# Patient Record
Sex: Female | Born: 1940 | Race: White | Hispanic: No | Smoking: Former smoker
Health system: Southern US, Community
[De-identification: ages and names within clinical notes are randomized; demographics above are authoritative.]

## PROBLEM LIST (undated history)

## (undated) DIAGNOSIS — N9489 Other specified conditions associated with female genital organs and menstrual cycle: Secondary | ICD-10-CM

## (undated) DIAGNOSIS — J449 Chronic obstructive pulmonary disease, unspecified: Secondary | ICD-10-CM

## (undated) DIAGNOSIS — M810 Age-related osteoporosis without current pathological fracture: Secondary | ICD-10-CM

## (undated) DIAGNOSIS — E785 Hyperlipidemia, unspecified: Secondary | ICD-10-CM

## (undated) DIAGNOSIS — R9389 Abnormal findings on diagnostic imaging of other specified body structures: Secondary | ICD-10-CM

## (undated) DIAGNOSIS — N390 Urinary tract infection, site not specified: Secondary | ICD-10-CM

## (undated) DIAGNOSIS — K219 Gastro-esophageal reflux disease without esophagitis: Secondary | ICD-10-CM

## (undated) HISTORY — DX: Chronic obstructive pulmonary disease, unspecified: J44.9

## (undated) HISTORY — DX: Hyperlipidemia, unspecified: E78.5

## (undated) HISTORY — DX: Abnormal findings on diagnostic imaging of other specified body structures: R93.89

## (undated) HISTORY — DX: Age-related osteoporosis without current pathological fracture: M81.0

## (undated) HISTORY — DX: Gastro-esophageal reflux disease without esophagitis: K21.9

## (undated) HISTORY — DX: Urinary tract infection, site not specified: N39.0

## (undated) HISTORY — DX: Other specified conditions associated with female genital organs and menstrual cycle: N94.89

---

## 1990-07-19 HISTORY — PX: COSMETIC SURGERY: SHX468

## 2004-05-14 ENCOUNTER — Ambulatory Visit: Payer: Self-pay | Admitting: Internal Medicine

## 2005-06-21 ENCOUNTER — Ambulatory Visit: Payer: Self-pay | Admitting: Internal Medicine

## 2005-08-02 ENCOUNTER — Ambulatory Visit: Payer: Self-pay | Admitting: Internal Medicine

## 2005-10-11 ENCOUNTER — Ambulatory Visit: Payer: Self-pay | Admitting: Neurosurgery

## 2006-05-12 ENCOUNTER — Ambulatory Visit: Payer: Self-pay | Admitting: Neurosurgery

## 2006-08-03 ENCOUNTER — Ambulatory Visit: Payer: Self-pay | Admitting: Internal Medicine

## 2006-08-09 ENCOUNTER — Ambulatory Visit: Payer: Self-pay | Admitting: Internal Medicine

## 2007-01-12 ENCOUNTER — Ambulatory Visit: Payer: Self-pay | Admitting: Gastroenterology

## 2007-02-03 ENCOUNTER — Ambulatory Visit: Payer: Self-pay | Admitting: Internal Medicine

## 2007-05-08 ENCOUNTER — Ambulatory Visit: Payer: Self-pay | Admitting: Neurosurgery

## 2007-07-07 ENCOUNTER — Ambulatory Visit: Payer: Self-pay | Admitting: Internal Medicine

## 2007-07-11 ENCOUNTER — Ambulatory Visit: Payer: Self-pay | Admitting: Internal Medicine

## 2007-07-20 HISTORY — PX: BREAST BIOPSY: SHX20

## 2007-08-29 ENCOUNTER — Ambulatory Visit: Payer: Self-pay | Admitting: Internal Medicine

## 2008-01-22 ENCOUNTER — Ambulatory Visit: Payer: Self-pay | Admitting: General Surgery

## 2008-02-06 ENCOUNTER — Ambulatory Visit: Payer: Self-pay | Admitting: General Surgery

## 2008-02-09 ENCOUNTER — Ambulatory Visit: Payer: Self-pay | Admitting: Surgery

## 2008-02-12 ENCOUNTER — Ambulatory Visit: Payer: Self-pay | Admitting: Surgery

## 2008-04-19 ENCOUNTER — Ambulatory Visit: Payer: Self-pay | Admitting: Internal Medicine

## 2009-01-23 ENCOUNTER — Ambulatory Visit: Payer: Self-pay | Admitting: Internal Medicine

## 2010-01-24 ENCOUNTER — Ambulatory Visit: Payer: Self-pay | Admitting: Gastroenterology

## 2010-01-27 ENCOUNTER — Ambulatory Visit: Payer: Self-pay | Admitting: Internal Medicine

## 2010-01-28 ENCOUNTER — Ambulatory Visit: Payer: Self-pay | Admitting: Gastroenterology

## 2010-01-30 LAB — PATHOLOGY REPORT

## 2010-07-03 ENCOUNTER — Ambulatory Visit: Payer: Self-pay | Admitting: Internal Medicine

## 2010-07-08 ENCOUNTER — Ambulatory Visit: Payer: Self-pay | Admitting: Internal Medicine

## 2010-07-30 ENCOUNTER — Encounter: Payer: Self-pay | Admitting: Neurosurgery

## 2010-08-18 ENCOUNTER — Ambulatory Visit: Payer: Self-pay | Admitting: Neurosurgery

## 2010-08-19 ENCOUNTER — Encounter: Payer: Self-pay | Admitting: Neurosurgery

## 2011-02-08 ENCOUNTER — Ambulatory Visit: Payer: Self-pay | Admitting: Internal Medicine

## 2011-07-20 LAB — HM DEXA SCAN

## 2011-09-25 ENCOUNTER — Emergency Department: Payer: Self-pay | Admitting: Emergency Medicine

## 2011-09-26 LAB — URINALYSIS, COMPLETE
Blood: NEGATIVE
Glucose,UR: NEGATIVE mg/dL (ref 0–75)
Granular Cast: 16
Hyaline Cast: 27
Nitrite: NEGATIVE
Protein: 100
RBC,UR: NONE SEEN /HPF (ref 0–5)
Specific Gravity: 1.02 (ref 1.003–1.030)

## 2011-09-26 LAB — COMPREHENSIVE METABOLIC PANEL
Alkaline Phosphatase: 69 U/L (ref 50–136)
Anion Gap: 14 (ref 7–16)
Bilirubin,Total: 0.7 mg/dL (ref 0.2–1.0)
Calcium, Total: 8.6 mg/dL (ref 8.5–10.1)
Chloride: 104 mmol/L (ref 98–107)
Co2: 26 mmol/L (ref 21–32)
EGFR (African American): >60
EGFR (Non-African Amer.): >60
SGOT(AST): 21 U/L (ref 15–37)
SGPT (ALT): 21 U/L

## 2011-09-26 LAB — CBC
HCT: 46.3 % (ref 35.0–47.0)
HGB: 15.6 g/dL (ref 12.0–16.0)
Platelet: 221 10*3/uL (ref 150–440)
RBC: 5.09 10*6/uL (ref 3.80–5.20)

## 2011-10-11 ENCOUNTER — Ambulatory Visit: Payer: Self-pay | Admitting: Gastroenterology

## 2012-02-09 ENCOUNTER — Ambulatory Visit: Payer: Self-pay | Admitting: Internal Medicine

## 2012-03-27 ENCOUNTER — Ambulatory Visit: Payer: Self-pay | Admitting: Gastroenterology

## 2012-03-30 LAB — PATHOLOGY REPORT

## 2013-01-16 LAB — HM MAMMOGRAPHY: HM MAMMO: NORMAL

## 2013-02-09 ENCOUNTER — Ambulatory Visit: Payer: Self-pay | Admitting: Internal Medicine

## 2014-01-03 DIAGNOSIS — E785 Hyperlipidemia, unspecified: Secondary | ICD-10-CM | POA: Insufficient documentation

## 2014-01-03 DIAGNOSIS — M1990 Unspecified osteoarthritis, unspecified site: Secondary | ICD-10-CM | POA: Insufficient documentation

## 2014-01-03 DIAGNOSIS — J449 Chronic obstructive pulmonary disease, unspecified: Secondary | ICD-10-CM | POA: Insufficient documentation

## 2014-01-16 ENCOUNTER — Ambulatory Visit: Payer: Self-pay | Admitting: Obstetrics and Gynecology

## 2014-01-16 LAB — HEMOGLOBIN: HGB: 13.8 g/dL (ref 12.0–16.0)

## 2014-01-21 ENCOUNTER — Ambulatory Visit: Payer: Self-pay | Admitting: Obstetrics and Gynecology

## 2014-01-23 LAB — PATHOLOGY REPORT

## 2014-02-11 ENCOUNTER — Ambulatory Visit: Payer: Self-pay | Admitting: Internal Medicine

## 2014-02-13 ENCOUNTER — Ambulatory Visit: Payer: Self-pay | Admitting: Internal Medicine

## 2014-10-11 ENCOUNTER — Emergency Department: Payer: Self-pay | Admitting: Emergency Medicine

## 2014-10-11 LAB — URINALYSIS, COMPLETE
BLOOD: NEGATIVE
Bacteria: NONE SEEN
Bilirubin,UR: NEGATIVE
GLUCOSE, UR: NEGATIVE mg/dL (ref 0–75)
Hyaline Cast: 16
Leukocyte Esterase: NEGATIVE
NITRITE: NEGATIVE
Ph: 5 (ref 4.5–8.0)
Protein: 30
RBC,UR: 1 /HPF (ref 0–5)
Specific Gravity: 1.02 (ref 1.003–1.030)
Squamous Epithelial: 1

## 2014-10-11 LAB — COMPREHENSIVE METABOLIC PANEL
ALK PHOS: 63 U/L
ANION GAP: 7 (ref 7–16)
Albumin: 3.8 g/dL
BILIRUBIN TOTAL: 0.8 mg/dL
BUN: 20 mg/dL
CHLORIDE: 108 mmol/L
CO2: 25 mmol/L
Calcium, Total: 8.6 mg/dL — ABNORMAL LOW
Creatinine: 0.79 mg/dL
EGFR (African American): >60
EGFR (Non-African Amer.): >60
Glucose: 113 mg/dL — ABNORMAL HIGH
Potassium: 4 mmol/L
SGOT(AST): 24 U/L
SGPT (ALT): 22 U/L
Sodium: 140 mmol/L
Total Protein: 6.6 g/dL

## 2014-10-11 LAB — CBC
HCT: 40.7 % (ref 35.0–47.0)
HGB: 13.6 g/dL (ref 12.0–16.0)
MCH: 30.1 pg (ref 26.0–34.0)
MCHC: 33.4 g/dL (ref 32.0–36.0)
MCV: 90 fL (ref 80–100)
PLATELETS: 219 10*3/uL (ref 150–440)
RBC: 4.52 10*6/uL (ref 3.80–5.20)
RDW: 12.9 % (ref 11.5–14.5)
WBC: 8.1 10*3/uL (ref 3.6–11.0)

## 2014-11-09 NOTE — Op Note (Signed)
PATIENT NAME:  Joyce Armstrong, Joyce Armstrong MR#:  [id number] DATE OF BIRTH:  04-20-1941  DATE OF PROCEDURE:  01/21/2014  PREOPERATIVE DIAGNOSES:  1.  Pelvic congestion syndrome.  2.  Thickened endometrium.   POSTOPERATIVE DIAGNOSES:  1.  Pelvic congestion syndrome.  2.  Thickened endometrium.   PROCEDURES PERFORMED: Hysteroscopy, dilation and curettage.  SURGEON: Hildred LaserAnika Maricus Tanzi, M.D.   ANESTHESIA: General.   ESTIMATED BLOOD LOSS: Minimal.   OPERATIVE FLUIDS: Included 800 mL of IVF.   URINE OUTPUT: 100 mL.   COMPLICATIONS: None.   FINDINGS: Atrophic-appearing endometrium. Both tubal ostia were identified. No endometrial masses appreciated.   SPECIMENS: Endometrial curettings.  TECHNIQUE: The patient was taken to the operating room where she was placed under general anesthesia without difficulty. She was then prepped and draped in normal sterile fashion in the dorsal lithotomy position. She was then straight catheterized with return of 100 mL of clear urine. Next, a sterile speculum was inserted into the vagina. A single-tooth tenaculum was used to grasp the anterior lip of the cervix. The uterus was then sounded to approximately 7.5 cm. Next, cervical dilation was performed. After this a 5 mm hysteroscope was then introduced into the uterus under direct visualization. The cavity was allowed to fill and the entire uterine cavity was explored with the findings above. The scope was removed and a sharp curette was then passed into the uterus and endometrial sampling was collected for pathology. Next, the tenaculum was removed, the speculum was removed and excellent hemostasis was noted. Instrument and sponge counts were correct x2 at the end of the procedure. The patient was taken to the PAC-U in stable condition.   ____________________________ Jacques EarthlyAnika S. Valentino Saxonherry, MD asc:sb D: 01/21/2014 08:41:58 ET T: 01/21/2014 09:11:08 ET JOB#: [id number]  cc: Jacques EarthlyAnika S. Valentino Saxonherry, MD, <Dictator> Fabian NovemberANIKA S Suad Autrey  MD ELECTRONICALLY SIGNED 01/28/2014 10:52

## 2014-12-31 ENCOUNTER — Encounter: Payer: Self-pay | Admitting: Obstetrics and Gynecology

## 2015-01-07 ENCOUNTER — Ambulatory Visit (INDEPENDENT_AMBULATORY_CARE_PROVIDER_SITE_OTHER): Payer: PPO | Admitting: Obstetrics and Gynecology

## 2015-01-07 ENCOUNTER — Encounter: Payer: Self-pay | Admitting: Obstetrics and Gynecology

## 2015-01-07 VITALS — BP 108/48 | HR 66 | Ht 62.5 in | Wt 132.8 lb

## 2015-01-07 DIAGNOSIS — L989 Disorder of the skin and subcutaneous tissue, unspecified: Secondary | ICD-10-CM | POA: Diagnosis not present

## 2015-01-07 DIAGNOSIS — Z01419 Encounter for gynecological examination (general) (routine) without abnormal findings: Secondary | ICD-10-CM | POA: Diagnosis not present

## 2015-01-07 NOTE — Progress Notes (Signed)
Subjective:    Joyce Armstrong is a 74 y.o. G3P3 single postmenopausal female who presents for annual exam. The patient has no complaints today. The patient is not sexually active. GYN screening history: last pap: patient does not recall when last pap was and was normal and last mammogram: approximate date 01/2013 and was normal.  Last colonoscopy The patient is not taking hormone replacement therapy. Patient denies post-menopausal vaginal bleeding.. The patient is not sexually active.The patient wears seatbelts: yes. The patient participates in regular exercise: no. Has the patient ever been transfused or tattooed?: no. The patient reports that there is not domestic violence in her life.   Menstrual History: OB History    Gravida Para Term Preterm AB TAB SAB Ectopic Multiple Living   3 3 3              Menarche age: 74  No LMP recorded. Patient is postmenopausal.  Denies h/o STIs or abnormal pap smears in the past.     Health Maintenance:  Last colonoscopy: 2013, normal.  Last Dexa Scan: 2013.  Osteoporosis.   Past Medical History  Diagnosis Date  . Osteoporosis   . GERD (gastroesophageal reflux disease)   . Thickened endometrium     s/p hysteroscopy d&c on 01/21/2014 benign endometrium and no endometrial masses. Repeat u/s in 6 months with thin lining, still noting some pelvic congestion.   Marland Kitchen COPD (chronic obstructive pulmonary disease)   . Hyperlipidemia   . UTI (urinary tract infection)   . Pelvic congestion     incidental finding of left adnexa vascular congestion on ct and pelvis ultrasound and possible left adnexal cyst   Family History  Problem Relation Age of Onset  . Breast cancer Maternal Grandmother   . Thyroid disease Mother    Past Surgical History  Procedure Laterality Date  . Cosmetic surgery  1992  . Breast biopsy     History   Social History  . Marital Status: Widowed    Spouse Name: N/A  . Number of Children: N/A  . Years of Education: N/A    Occupational History  . Not on file.   Social History Main Topics  . Smoking status: Former Smoker    Quit date: 10/17/2009  . Smokeless tobacco: Never Used  . Alcohol Use: No  . Drug Use: No  . Sexual Activity: Not Currently   Other Topics Concern  . Not on file   Social History Narrative   Current Outpatient Prescriptions on File Prior to Visit  Medication Sig Dispense Refill  . dicyclomine (BENTYL) 10 MG capsule Take 10 mg by mouth 4 (four) times daily -  before meals and at bedtime.    . Probiotic Product (PROBIOTIC DAILY PO) Take by mouth.     No current facility-administered medications on file prior to visit.   No Known Allergies   Review of Systems Constitutional: negative for chills, fatigue, fevers and sweats Eyes: negative for irritation, redness and visual disturbance Ears, nose, mouth, throat, and face: negative for hearing loss, nasal congestion, snoring and tinnitus Respiratory: negative for asthma, cough, sputum Cardiovascular: negative for chest pain, dyspnea, exertional chest pressure/discomfort, irregular heart beat, palpitations and syncope Gastrointestinal: negative for abdominal pain, change in bowel habits, nausea and vomiting Genitourinary: negative for abnormal menstrual periods, genital lesions, sexual problems and vaginal discharge, dysuria and urinary incontinence Integument/breast: negative for breast lump, breast tenderness and nipple discharge Hematologic/lymphatic: negative for bleeding and easy bruising Musculoskeletal:positive for joint pain; negative for back pain  and muscle weakness Neurological: negative for dizziness, headaches, vertigo and weakness Endocrine: negative for diabetic symptoms including polydipsia, polyuria and skin dryness Allergic/Immunologic: negative for hay fever and urticaria      Objective:    BP 108/48 mmHg  Pulse 66  Ht 5' 2.5" (1.588 m)  Wt 132 lb 12.8 oz (60.238 kg)  BMI 23.89 kg/m2  General  Appearance:    Alert, cooperative, no distress, appears stated age  Head:    Normocephalic, without obvious abnormality, atraumatic  Eyes:    PERRL, conjunctiva/corneas clear, EOM's intact, both eyes  Ears:    Normal external ear canals, both ears  Nose:   Nares normal, septum midline, mucosa normal, no drainage or sinus tenderness  Throat:   Lips, mucosa, and tongue normal; teeth and gums normal  Neck:   Supple, symmetrical, trachea midline, no adenopathy;  thyroid: no enlargement/tenderness/nodules; no carotid bruit or JVD  Back:     Symmetric, no curvature, ROM normal, no CVA tenderness  Lungs:     Clear to auscultation bilaterally, respirations unlabored  Chest Wall:    No tenderness or deformity   Heart:    Regular rate and rhythm, S1 and S2 normal, no murmur, rub   or gallop  Breast Exam:    No tenderness, masses, or nipple abnormality  Abdomen:     Soft, non-tender, bowel sounds active all four quadrants, no masses, no organomegaly  Genitalia:    Normal female without lesion, discharge or tenderness  Rectal:    Normal external sphincter.  Internal not done.   Extremities:   Extremities normal, atraumatic, no cyanosis or edema  Pulses:   2+ and symmetric all extremities  Skin:   Skin color, texture, turgor normal, no rashes.  Several small moles and skin tags throughout body.  Small (~ 5 mm) hyperpigmented, raised, well circumscribed lesion on left buttock.   Lymph nodes:   Cervical, supraclavicular, and axillary nodes normal  Neurologic:   CNII-XII intact, normal strength, sensation and reflexes throughout      Assessment:    Normal gyn exam Menopause   Arthritis SKin lesion on buttock (skin tag vs mole) - new lesion per patient.  Plan:    Breast self exam technique reviewed and patient encouraged to perform self-exam monthly. Discussed healthy lifestyle modifications.  Encouraged exercise routine/weight bearing exercises.  Mammogram. To be scheduled.  Patient has appointment  today also for PCP.  Will possibly have annual labs drawn there.  Given annual screening labs as future order in case PCP does not draw labs.  Will have Dermatologist to look at area on buttock at next appointment, to see if biopsy is recommended.  Otherwise can RTC and have it followed and potentially excised here in office.  RTC in 1-2 years.     Hildred Laser, MD [facility]

## 2015-01-08 ENCOUNTER — Other Ambulatory Visit: Payer: Self-pay | Admitting: Internal Medicine

## 2015-01-08 DIAGNOSIS — M810 Age-related osteoporosis without current pathological fracture: Secondary | ICD-10-CM | POA: Insufficient documentation

## 2015-01-08 DIAGNOSIS — Z1231 Encounter for screening mammogram for malignant neoplasm of breast: Secondary | ICD-10-CM

## 2015-02-17 ENCOUNTER — Ambulatory Visit
Admission: RE | Admit: 2015-02-17 | Discharge: 2015-02-17 | Disposition: A | Discharge status: Home / Self Care | Payer: PPO | Source: Ambulatory Visit | Attending: Internal Medicine | Admitting: Internal Medicine

## 2015-02-17 DIAGNOSIS — Z1231 Encounter for screening mammogram for malignant neoplasm of breast: Secondary | ICD-10-CM

## 2015-08-20 ENCOUNTER — Other Ambulatory Visit: Payer: Self-pay | Admitting: Neurosurgery

## 2015-08-20 DIAGNOSIS — M5440 Lumbago with sciatica, unspecified side: Secondary | ICD-10-CM

## 2015-09-01 ENCOUNTER — Ambulatory Visit
Admission: RE | Admit: 2015-09-01 | Discharge: 2015-09-01 | Disposition: A | Discharge status: Home / Self Care | Payer: PPO | Source: Ambulatory Visit | Attending: Neurosurgery | Admitting: Neurosurgery

## 2015-09-01 DIAGNOSIS — M5440 Lumbago with sciatica, unspecified side: Secondary | ICD-10-CM | POA: Diagnosis present

## 2015-09-01 DIAGNOSIS — M4806 Spinal stenosis, lumbar region: Secondary | ICD-10-CM | POA: Diagnosis not present

## 2015-09-01 DIAGNOSIS — M5126 Other intervertebral disc displacement, lumbar region: Secondary | ICD-10-CM | POA: Diagnosis not present

## 2015-09-01 DIAGNOSIS — M47816 Spondylosis without myelopathy or radiculopathy, lumbar region: Secondary | ICD-10-CM | POA: Insufficient documentation

## 2015-09-03 ENCOUNTER — Other Ambulatory Visit: Payer: Self-pay | Admitting: Nurse Practitioner

## 2015-09-03 DIAGNOSIS — R1032 Left lower quadrant pain: Principal | ICD-10-CM

## 2015-09-03 DIAGNOSIS — R1031 Right lower quadrant pain: Secondary | ICD-10-CM

## 2015-09-04 ENCOUNTER — Ambulatory Visit
Admission: RE | Admit: 2015-09-04 | Discharge: 2015-09-04 | Disposition: A | Discharge status: Home / Self Care | Payer: PPO | Source: Ambulatory Visit | Attending: Nurse Practitioner | Admitting: Nurse Practitioner

## 2015-09-04 DIAGNOSIS — R1030 Lower abdominal pain, unspecified: Secondary | ICD-10-CM | POA: Diagnosis present

## 2015-09-04 DIAGNOSIS — R1032 Left lower quadrant pain: Secondary | ICD-10-CM

## 2015-09-04 DIAGNOSIS — R1031 Right lower quadrant pain: Secondary | ICD-10-CM

## 2015-09-04 MED ORDER — IOHEXOL 300 MG/ML  SOLN
100.0000 mL | Freq: Once | INTRAMUSCULAR | Status: AC | PRN
  Administered 2015-09-04: 100 mL via INTRAVENOUS

## 2015-09-11 ENCOUNTER — Ambulatory Visit: Payer: PPO | Attending: Internal Medicine | Admitting: Physical Therapy

## 2015-09-11 DIAGNOSIS — X58XXXA Exposure to other specified factors, initial encounter: Secondary | ICD-10-CM | POA: Insufficient documentation

## 2015-09-11 DIAGNOSIS — M25512 Pain in left shoulder: Secondary | ICD-10-CM | POA: Insufficient documentation

## 2015-09-11 DIAGNOSIS — T148 Other injury of unspecified body region: Secondary | ICD-10-CM | POA: Diagnosis present

## 2015-09-11 DIAGNOSIS — M542 Cervicalgia: Secondary | ICD-10-CM | POA: Insufficient documentation

## 2015-09-11 DIAGNOSIS — M2560 Stiffness of unspecified joint, not elsewhere classified: Secondary | ICD-10-CM | POA: Diagnosis present

## 2015-09-11 DIAGNOSIS — T148XXA Other injury of unspecified body region, initial encounter: Secondary | ICD-10-CM

## 2015-09-11 DIAGNOSIS — R531 Weakness: Secondary | ICD-10-CM | POA: Insufficient documentation

## 2015-09-11 NOTE — Therapy (Signed)
[place] [facility] [address] [place], [address] Phone: [phone]   Fax:  [phone]  Physical Therapy Evaluation  Patient Details  Name: Joyce Armstrong MRN: [id number] Date of Birth: [birth year] Referring Provider: Ronaldo Miyamoto L. Alphonzo Lemmings, MD  Encounter Date: 09/11/2015      PT End of Session - 09/11/15 1407    Visit Number 1   Number of Visits 13   Date for PT Re-Evaluation 10/23/15   PT Start Time 1130   PT Stop Time 1230   PT Time Calculation (min) 60 min   Activity Tolerance Patient tolerated treatment well   Behavior During Therapy [facility] for tasks assessed/performed      Past Medical History  Diagnosis Date  . Osteoporosis   . GERD (gastroesophageal reflux disease)   . Thickened endometrium     s/p hysteroscopy d&c on 01/21/2014 benign endometrium and no endometrial masses. will repeat u/s in 6 months to reassess lining and congestion.  Marland Kitchen COPD (chronic obstructive pulmonary disease)   . Hyperlipidemia   . UTI (urinary tract infection)   . Pelvic congestion     incidental finding of left adnexa vascular congestion on ct and pelvis ultrasound and possible left adnexal cyst    Past Surgical History  Procedure Laterality Date  . Cosmetic surgery  1992  . Breast biopsy Right 2009    neg    There were no vitals filed for this visit.  Visit Diagnosis:  Muscle tear  Weakness  Range of motion deficit      Subjective Assessment - 09/11/15 1324    Subjective Pt reports Hx of L shoulder pain beginning September of last year.  Cc  of difficulty lifting anything with L UE, reports cortisone shot 2 or more weeks ago which helped as she is now able to lift arm overhead  w/o pain if done slowly. Pt is currently having difficulty washing back, fairly constant and current pain of 4/10 reaching 5/10 when lifting, reports pain as feeling "like I have been bruised on the shoulder" in the deltoid region.  Pt enjoys  playing cards on Tuesdays and Friday, has not been performing regular exercise, past Hx of regular walking, often watches TV and plays games on an ipad, primarily sedentary lifestyle.    Pertinent History Cortisone shot in shoulder 2 + weeks ago   Limitations Lifting   Diagnostic tests MRI showing mild distal supraspinatus tendinopathy, partial thickness articular surface tearing of the subscapularis tendon    Patient Stated Goals regular walking, no pain when lifting things, full use of L UE during ADLs   Currently in Pain? Yes   Pain Score 4    Pain Location Shoulder   Pain Orientation Left   Pain Descriptors / Indicators Constant;Dull            [facility] PT Assessment - 09/11/15 0001    Assessment   Medical Diagnosis supraspinatus tendionpathy, partial thickness articular surface tearing of the subscap   Referring Provider Karma Ganja MD   Onset Date/Surgical Date 03/20/15   Hand Dominance Right   Next MD Visit march 15th   Home Environment   Living Environment Private residence   Prior Function   Level of Independence Independent   Vocation Retired   Clinical research associate, games on ipad   Cognition   Overall Cognitive Status Within Functional Limits for tasks assessed   Functional Tests   Functional tests --  HBB/HBH = B, [facility]  Posture/Postural Control   Posture/Postural Control Postural limitations   Postural Limitations Rounded Shoulders  sacral sitting   ROM / Strength   AROM / PROM / Strength PROM;Strength;AROM   AROM   Overall AROM Comments decreased shoulder flexion, abduction, and ER. Measurments taken passivly    PROM   Overall PROM Comments R: flexion = 170, abd = 168, IR = 80, ER = 95. L: flexion = 146, abd = 125, IR = 85, ER = 40   Strength   Overall Strength Comments L IR = 3/5   Palpation   Palpation comment tenderness at infraspinatus, delts, teres minor/major   Ambulation/Gait   Ambulation/Gait Yes   Gait Comments decreased L arm swing       Objective:   STM of supraspinatus, deltoids, infraspinatus, teres minor/major was performed to address pt complaints of continueous pain in these areas. Pt tolerated well reporting decreased resting pain to 2/10 post STM. Next, IR/ER isometrics form and posture was demonstrated using a doorframe. Pt performed 6 sets for both IR/ER with 6sec holds. Pt tolerated well reporting no increase in pain. Pt required cueing for shoulder depression and retraction.  Instructed to repeat the above exercise 2X daily or prn for pain.                       PT Education - 09/11/15 1405    Education provided Yes   Education Details ADLs and exercises only in nonpainful ROM, IR/ER isometrics, increase activity   Person(s) Educated Patient   Methods Explanation   Comprehension Verbalized understanding             PT Long Term Goals - 09/11/15 1434    PT LONG TERM GOAL #1   Title Pt will lift 10 pounds from hip level to counter height w/ L UE w/o pain to facilitate non painful ADLs such as transporting groceries.    Baseline currently painful lifting above hip level   Time 6   Period Weeks   Status New   PT LONG TERM GOAL #2   Title Pt will be able to perform relaxed arm swings for 1 min w/o pain to facilitate ideal gait w/o pain   Baseline pain w/ gait/arm swinging   Time 4   Period Weeks   Status New   PT LONG TERM GOAL #3   Title Pt will be able to vacuum her living room w/o shoulder pain to maintain independence   Time 6   Period Weeks   Status New               Plan - 09/11/15 1430    Clinical Impression Statement Pt is a pleasant 75 y.o female presenting with difficulty performing L UE elevation, decreased ER strength, and tenderness of deltoids, infraspinatus, and teres major/minor area. Pt is limited in ADLs primary involving any UE lifting activities and vacuuming. Pt reports continuous dull pain of the deltoid which has limited social interaction such as  playing cards and walking. Pt will benefit from PT to facilitate RC partial tear/tendinopathy healing process, address shoulder strength deficits, and education in regards to increasing general activity level for long-term health.      Pt will benefit from skilled therapeutic intervention in order to improve on the following deficits Decreased activity tolerance;Decreased mobility;Decreased strength;Impaired UE functional use;Decreased endurance;Decreased range of motion;Difficulty walking   Rehab Potential Good   Clinical Impairments Affecting Rehab Potential age, proir level of activity   PT  Frequency 2x / week   PT Duration 6 weeks   PT Treatment/Interventions Therapeutic exercise;Therapeutic activities;Functional mobility training;Neuromuscular re-education;Patient/family education;Manual techniques;Passive range of motion   PT Next Visit Plan assess performance of HEP, ER strength to onset of pain, encourage functional movement of UE such as arm bike   PT Home Exercise Plan IR/ER isometrics   Consulted and Agree with Plan of Care Patient         Problem List Patient Active Problem List   Diagnosis Date Noted  . Osteoporosis 01/08/2015  . Arthritis 01/03/2014  . CAFL (chronic airflow limitation) (HCC) 01/03/2014  . HLD (hyperlipidemia) 01/03/2014    Waldemar Dickens STM 09/11/2015, 2:52 PM  Su Hoff PT DPT  [place] [facility] SPORTS MEDICINE [address] [place], [address] Phone: [phone]   Fax:  [phone]  Name: Joyce Armstrong MRN: [id number] Date of Birth: 1941/07/12

## 2015-09-16 ENCOUNTER — Encounter: Payer: PPO | Admitting: Physical Therapy

## 2015-09-16 ENCOUNTER — Ambulatory Visit: Payer: PPO | Admitting: Physical Therapy

## 2015-09-16 DIAGNOSIS — M25512 Pain in left shoulder: Secondary | ICD-10-CM

## 2015-09-16 DIAGNOSIS — M542 Cervicalgia: Secondary | ICD-10-CM

## 2015-09-16 DIAGNOSIS — R531 Weakness: Secondary | ICD-10-CM

## 2015-09-16 DIAGNOSIS — T148 Other injury of unspecified body region: Secondary | ICD-10-CM | POA: Diagnosis not present

## 2015-09-16 NOTE — Therapy (Signed)
[place] [facility] [address] [place], [address] Phone: [phone]   Fax:  [phone]  Physical Therapy Treatment  Patient Details  Name: Joyce Armstrong MRN: [id number] Date of Birth: [birth year] Referring Provider: Nilda Simmer  Encounter Date: 09/16/2015      PT End of Session - 09/16/15 1123    Visit Number 2   Number of Visits 13   Date for PT Re-Evaluation 10/23/15   PT Start Time 1030   PT Stop Time 1115   PT Time Calculation (min) 45 min   Activity Tolerance Patient tolerated treatment well   Behavior During Therapy [facility] for tasks assessed/performed      Past Medical History  Diagnosis Date  . Osteoporosis   . GERD (gastroesophageal reflux disease)   . Thickened endometrium     s/p hysteroscopy d&c on 01/21/2014 benign endometrium and no endometrial masses. will repeat u/s in 6 months to reassess lining and congestion.  Marland Kitchen COPD (chronic obstructive pulmonary disease)   . Hyperlipidemia   . UTI (urinary tract infection)   . Pelvic congestion     incidental finding of left adnexa vascular congestion on ct and pelvis ultrasound and possible left adnexal cyst    Past Surgical History  Procedure Laterality Date  . Cosmetic surgery  1992  . Breast biopsy Right 2009    neg    There were no vitals filed for this visit.  Visit Diagnosis:  Weakness  Cervicalgia  Left shoulder pain      Subjective Assessment - 09/16/15 1031    Subjective Pt reports minimal to no pain in her arm until today when her pain became significant. As the day continued pt has had incr. pain. She has not taken any tylenol.    Pertinent History Cortisone shot in shoulder 2 + weeks ago   Limitations Lifting   Diagnostic tests MRI showing mild distal supraspinatus tendinopathy, partial thickness articular surface tearing of the subscapularis tendon    Patient Stated Goals regular walking, no pain when lifting things,  full use of L UE during ADLs   Currently in Pain? Yes   Pain Score 3    Pain Location Arm   Pain Orientation Left              Objective: CPAs grade II 3x1 min T2-T5, L UPAs in same region. Following this ROM improved 15 deg. For shoulder flex before onset of pain.  STM performed on L levator, supraspinatus, deltoid. Noted significant taut band in deltoid which improved with manual intervention.  Cervical retractions, 5 min spent teaching and practicing, requiring cuing for correct performance and breathing.  Added RTB scapular retraction with cervical retraction, 3x10. Pt does not feel comfortable performing this as HEP.  Performed "W" exercise for isometric activation of low trap, 5x5 sec. Holds.  Reviewed HEP, corrected wall isometrics.                   PT Education - 09/16/15 1118    Education provided Yes   Education Details corrected exercises and added additional exercises to address trunk control.              PT Long Term Goals - 09/11/15 1434    PT LONG TERM GOAL #1   Title Pt will lift 10 pounds from hip level to counter height w/ L UE w/o pain to facilitate non painful ADLs such as transporting groceries.    Baseline  currently painful lifting above hip level   Time 6   Period Weeks   Status New   PT LONG TERM GOAL #2   Title Pt will be able to perform relaxed arm swings for 1 min w/o pain to facilitate ideal gait w/o pain   Baseline pain w/ gait/arm swinging   Time 4   Period Weeks   Status New   PT LONG TERM GOAL #3   Title Pt will be able to vacuum her living room w/o shoulder pain to maintain independence   Time 6   Period Weeks   Status New               Plan - 09/16/15 1124    Clinical Impression Statement Pt appears to have made improvement in resting pain, however no change with L UE elevation. required considerable correction on her exercises and lacks confidence in her ability to perform exercises without  considerable guidance. Pt appears to have cervical issues related to her shoulder pain and would benefit from continued PT to address this.   Pt will benefit from skilled therapeutic intervention in order to improve on the following deficits Decreased activity tolerance;Decreased mobility;Decreased strength;Impaired UE functional use;Decreased endurance;Decreased range of motion;Difficulty walking   Rehab Potential Good   Clinical Impairments Affecting Rehab Potential age, proir level of activity   PT Frequency 2x / week   PT Duration 6 weeks   PT Treatment/Interventions Therapeutic exercise;Therapeutic activities;Functional mobility training;Neuromuscular re-education;Patient/family education;Manual techniques;Passive range of motion   PT Next Visit Plan assess performance of HEP, ER strength to onset of pain, encourage functional movement of UE such as arm bike   PT Home Exercise Plan IR/ER isometrics   Consulted and Agree with Plan of Care Patient        Problem List Patient Active Problem List   Diagnosis Date Noted  . Osteoporosis 01/08/2015  . Arthritis 01/03/2014  . CAFL (chronic airflow limitation) (HCC) 01/03/2014  . HLD (hyperlipidemia) 01/03/2014    Joyce Armstrong PT DPT 09/16/2015, 11:26 AM  [place] [facility] SPORTS MEDIC[facility] [address] [place], [address] Phone: [phone]   Fax:  [phone]  Name: Joyce Armstrong MRN: [id number] Date of Birth: 04-Jun-1941

## 2015-09-18 ENCOUNTER — Ambulatory Visit: Payer: PPO | Attending: Orthopedic Surgery | Admitting: Physical Therapy

## 2015-09-18 DIAGNOSIS — M25552 Pain in left hip: Secondary | ICD-10-CM | POA: Diagnosis present

## 2015-09-18 DIAGNOSIS — T148 Other injury of unspecified body region: Secondary | ICD-10-CM | POA: Diagnosis present

## 2015-09-18 DIAGNOSIS — M545 Low back pain: Secondary | ICD-10-CM | POA: Insufficient documentation

## 2015-09-18 DIAGNOSIS — G8929 Other chronic pain: Secondary | ICD-10-CM | POA: Insufficient documentation

## 2015-09-18 DIAGNOSIS — M25551 Pain in right hip: Secondary | ICD-10-CM | POA: Insufficient documentation

## 2015-09-18 DIAGNOSIS — X58XXXA Exposure to other specified factors, initial encounter: Secondary | ICD-10-CM | POA: Diagnosis not present

## 2015-09-18 DIAGNOSIS — M25512 Pain in left shoulder: Secondary | ICD-10-CM | POA: Insufficient documentation

## 2015-09-18 DIAGNOSIS — M25659 Stiffness of unspecified hip, not elsewhere classified: Secondary | ICD-10-CM | POA: Insufficient documentation

## 2015-09-18 DIAGNOSIS — R531 Weakness: Secondary | ICD-10-CM | POA: Diagnosis present

## 2015-09-18 NOTE — Therapy (Signed)
[facility] [address] [place], [address] Phone: [phone]   Fax:  [phone]  Physical Therapy Treatment  Patient Details  Name: Joyce Armstrong MRN: [id number] Date of Birth: 1941-03-03 Referring Provider: Nilda Simmer  Encounter Date: 09/18/2015      PT End of Session - 09/18/15 1357    Visit Number 3   Number of Visits 13   Date for PT Re-Evaluation 10/23/15   PT Start Time 0145   PT Stop Time 0230   PT Time Calculation (min) 45 min   Activity Tolerance Patient tolerated treatment well   Behavior During Therapy [facility] for tasks assessed/performed      Past Medical History  Diagnosis Date  . Osteoporosis   . GERD (gastroesophageal reflux disease)   . Thickened endometrium     s/p hysteroscopy d&c on 01/21/2014 benign endometrium and no endometrial masses. will repeat u/s in 6 months to reassess lining and congestion.  Marland Kitchen COPD (chronic obstructive pulmonary disease)   . Hyperlipidemia   . UTI (urinary tract infection)   . Pelvic congestion     incidental finding of left adnexa vascular congestion on ct and pelvis ultrasound and possible left adnexal cyst    Past Surgical History  Procedure Laterality Date  . Cosmetic surgery  1992  . Breast biopsy Right 2009    neg    There were no vitals filed for this visit.  Visit Diagnosis:  Left shoulder pain  Weakness      Subjective Assessment - 09/18/15 1355    Subjective Pt continues to have moderate pain in shoulder since Monday. She reports her pain level is better but it feels like a "bruise" in her shoulder. Today pt reports no pain but significant soreness.   Pertinent History Cortisone shot in shoulder 2 + weeks ago   Limitations Lifting   Diagnostic tests MRI showing mild distal supraspinatus tendinopathy, partial thickness articular surface tearing of the subscapularis tendon    Patient Stated Goals regular walking, no pain when  lifting things, full use of L UE during ADLs   Currently in Pain? No/denies             Objective: STM performed on deltoid, deltoid insertion, biceps. Following this pt reported improvement in resting "tightness".  AROM for shoulder elevation in multiple orientations. Pain free with true flexion.  Biceps isometrics 5x1 min holds with 3# wt at 90 deg. Elbow flexion. Following this pt reported incr. Biceps pain with shoulder elevation.  GTB scapular retractions 3x10 with focus on biceps activation.  Additional STM on biceps as biceps were irritated following previous exercises.                         PT Long Term Goals - 09/11/15 1434    PT LONG TERM GOAL #1   Title Pt will lift 10 pounds from hip level to counter height w/ L UE w/o pain to facilitate non painful ADLs such as transporting groceries.    Baseline currently painful lifting above hip level   Time 6   Period Weeks   Status New   PT LONG TERM GOAL #2   Title Pt will be able to perform relaxed arm swings for 1 min w/o pain to facilitate ideal gait w/o pain   Baseline pain w/ gait/arm swinging   Time 4   Period Weeks   Status New   PT LONG  TERM GOAL #3   Title Pt will be able to vacuum her living room w/o shoulder pain to maintain independence   Time 6   Period Weeks   Status New               Plan - 09/18/15 1503    Clinical Impression Statement now able to perform L rotation in strict saggital plane pain free. continues to have pain with other elevation positions. repeated elevation produced biceps pain so focused on isometric biceps activation. pt would benefit from continued PT to address muscle weakness, impaired ROM, and pain.   Pt will benefit from skilled therapeutic intervention in order to improve on the following deficits Decreased activity tolerance;Decreased mobility;Decreased strength;Impaired UE functional use;Decreased endurance;Decreased range of motion;Difficulty  walking   Rehab Potential Good   Clinical Impairments Affecting Rehab Potential age, proir level of activity   PT Frequency 2x / week   PT Duration 6 weeks   PT Treatment/Interventions Therapeutic exercise;Therapeutic activities;Functional mobility training;Neuromuscular re-education;Patient/family education;Manual techniques;Passive range of motion   PT Next Visit Plan assess performance of HEP, ER strength to onset of pain, encourage functional movement of UE such as arm bike   PT Home Exercise Plan IR/ER isometrics   Consulted and Agree with Plan of Care Patient        Problem List Patient Active Problem List   Diagnosis Date Noted  . Osteoporosis 01/08/2015  . Arthritis 01/03/2014  . CAFL (chronic airflow limitation) (HCC) 01/03/2014  . HLD (hyperlipidemia) 01/03/2014    Joyce Armstrong PT DPT 09/18/2015, 3:12 PM  [place] [facility] PHYSICAL AND SPORTS MEDICINE [address] [place], [address] Phone: [phone]   Fax:  [phone]  Name: Joyce Armstrong MRN: [id number] Date of Birth: 1940/12/25

## 2015-09-22 ENCOUNTER — Ambulatory Visit: Payer: PPO

## 2015-09-22 DIAGNOSIS — R531 Weakness: Secondary | ICD-10-CM

## 2015-09-22 DIAGNOSIS — M25512 Pain in left shoulder: Secondary | ICD-10-CM | POA: Diagnosis not present

## 2015-09-22 DIAGNOSIS — T148XXA Other injury of unspecified body region, initial encounter: Secondary | ICD-10-CM

## 2015-09-22 NOTE — Therapy (Signed)
Joyce Armstrong [facility] [address] [place], [place], [address] Phone: [phone]   Fax:  [phone]  Physical Therapy Treatment  Patient Details  Name: Joyce Armstrong Joyce Armstrong MRN: [id number] Date of Birth: 06-15-1941 Referring Provider: Nilda Simmerobert A Wainer  Encounter Date: 09/22/2015      PT End of Session - 09/22/15 1349    Visit Number 4   Number of Visits 13   Date for PT Re-Evaluation 10/23/15   PT Start Time 1300   PT Stop Time 1345   PT Time Calculation (min) 45 min   Activity Tolerance Patient tolerated treatment well   Behavior [facility] for tasks assessed/performed      Past Medical History  Diagnosis Date  . Osteoporosis   . GERD (gastroesophageal reflux disease)   . Thickened endometrium     s/p hysteroscopy d&c on 01/21/2014 benign endometrium and no endometrial masses. will repeat u/s in 6 months to reassess lining and congestion.  Marland Kitchen. COPD (chronic obstructive pulmonary disease) (HCC)   . Hyperlipidemia   . UTI (urinary tract infection)   . Pelvic congestion     incidental finding of left adnexa vascular congestion on ct and pelvis ultrasound and possible left adnexal cyst    Past Surgical History  Procedure Laterality Date  . Cosmetic surgery  1992  . Breast biopsy Right 2009    neg    There were no vitals filed for this visit.  Visit Diagnosis:  Left shoulder pain  Weakness  Muscle tear      Subjective Assessment - 09/22/15 1307    Subjective Pt reports that she is not having any pain in her L shoulder at rest at this time. She still complains of L shoulder pain with overhead activities. Pt states that she did not perform any of her HEP over the weekend.    Pertinent History Cortisone shot in shoulder 2 + weeks ago   Limitations Lifting   Diagnostic tests MRI showing mild distal supraspinatus tendinopathy, partial thickness articular surface tearing of the subscapularis tendon    Patient  Stated Goals regular walking, no pain when lifting things, full use of L UE during ADLs   Currently in Pain? No/denies       Objective:  Manual Therapy STM performed on anterior deltoid and long head bicep tendon; Pt reports pain with palpation of long head bicep tendon.   There-ex Arm bike 2 min forward/2 min backward during history Eccentric L bicep flexion 10 second contraction x 10; Seated red Tband mid and low rows 2 x 10 each; Red Tband scapular retractions 2 x 10; Supine serratus punch with manual resistance 2 x 10; L shoulder ER, IR, and abduction isometrics 10 seconds x 5 each; L elbow flexion and extension isometrics 10 second hold x 5 each;                          PT Education - 09/22/15 1348    Education provided Yes   Education Details HEP reviewed and reinforced   Person(s) Educated Patient   Methods Explanation;Demonstration   Comprehension Verbalized understanding             PT Long Term Goals - 09/11/15 1434    PT LONG TERM GOAL #1   Title Pt will lift 10 pounds from hip level to counter height w/ L UE w/o pain to facilitate non painful ADLs such as transporting groceries.  Baseline currently painful lifting above hip level   Time 6   Period Weeks   Status New   PT LONG TERM GOAL #2   Title Pt will be able to perform relaxed arm swings for 1 min w/o pain to facilitate ideal gait w/o pain   Baseline pain w/ gait/arm swinging   Time 4   Period Weeks   Status New   PT LONG TERM GOAL #3   Title Pt will be able to vacuum her living room w/o shoulder pain to maintain independence   Time 6   Period Weeks   Status New               Plan - 09/22/15 1349    Clinical Impression Statement Pt reports none to minimal pain with flexion following exercises. No pain with scaption. Pt with significant tenderness of L long head bicep tendon during STM. Pt requires cues to avoid shoulder hiking during scapular retractions. Pt will  continue to benefit from skilled interventions to decreased L shoulder pain and improve function at home.    Pt will benefit from skilled therapeutic intervention in order to improve on the following deficits Decreased activity tolerance;Decreased mobility;Decreased strength;Impaired UE functional use;Decreased endurance;Decreased range of motion;Difficulty walking   Rehab Potential Good   Clinical Impairments Affecting Rehab Potential age, proir level of activity   PT Frequency 2x / week   PT Duration 6 weeks   PT Treatment/Interventions Therapeutic exercise;Therapeutic activities;Functional mobility training;Neuromuscular re-education;Patient/family education;Manual techniques;Passive range of motion   PT Next Visit Plan Repeat arm bike as decreases pain. Continue with strengthening and manual techniques for L shoulder   PT Home Exercise Plan IR/ER isometrics, encouraged to continue as prescribed.    Consulted and Agree with Plan of Care Patient        Problem List Patient Active Problem List   Diagnosis Date Noted  . Osteoporosis 01/08/2015  . Arthritis 01/03/2014  . CAFL (chronic airflow limitation) (HCC) 01/03/2014  . HLD (hyperlipidemia) 01/03/2014   Lynnea Maizes PT, DPT   Tyra Gural 09/22/2015, 1:53 PM  [place] [facility] PHYSICAL AND SPORTS MEDICINE [address], [place], [address] Phone: [phone]   Fax:  [phone]  Name: Joyce Armstrong MRN: [id number] Date of Birth: Aug 26, 1940

## 2015-09-25 ENCOUNTER — Ambulatory Visit: Payer: PPO | Admitting: Physical Therapy

## 2015-09-25 DIAGNOSIS — M25512 Pain in left shoulder: Secondary | ICD-10-CM

## 2015-09-25 NOTE — Therapy (Signed)
[place] [facility] SPORTS MEDICINE [address] [place], [address] Phone: [phone]   Fax:  [phone]  Physical Therapy Treatment  Patient Details  Name: Joyce Armstrong MRN: [id number] Date of Birth: 1941/03/31 Referring Provider: Lorn Junes  Encounter Date: 09/25/2015      PT End of Session - 09/25/15 1400    Visit Number 5   Number of Visits 13   Date for PT Re-Evaluation 10/23/15   PT Start Time 0300   PT Stop Time 1410   PT Time Calculation (min) 25 min   Activity Tolerance Patient tolerated treatment well   Behavior During Therapy [facility] for tasks assessed/performed      Past Medical History  Diagnosis Date  . Osteoporosis   . GERD (gastroesophageal reflux disease)   . Thickened endometrium     s/p hysteroscopy d&c on 01/21/2014 benign endometrium and no endometrial masses. will repeat u/s in 6 months to reassess lining and congestion.  Marland Kitchen COPD (chronic obstructive pulmonary disease) ([place])   . Hyperlipidemia   . UTI (urinary tract infection)   . Pelvic congestion     incidental finding of left adnexa vascular congestion on ct and pelvis ultrasound and possible left adnexal cyst    Past Surgical History  Procedure Laterality Date  . Cosmetic surgery  1992  . Breast biopsy Right 2009    neg    There were no vitals filed for this visit.  Visit Diagnosis:  Left shoulder pain      Subjective Assessment - 09/25/15 1351    Subjective Pt reports she is doing significantly better. She still gets some irritation in her L shoulder.   Pertinent History Cortisone shot in shoulder 2 + weeks ago   Limitations Lifting   Diagnostic tests MRI showing mild distal supraspinatus tendinopathy, partial thickness articular surface tearing of the subscapularis tendon    Patient Stated Goals regular walking, no pain when lifting things, full use of L UE during ADLs   Currently in Pain? No/denies   Pain Score 0-No pain             Objective: Reviewed HEP. Palpated through back, shoulder and arm. Noted significant improvement in all areas of palpation from eval other than biceps tendon, which is moderately irritated. Issued and had pt perform biceps isometrics in order to treat this tendinopathy which resulted in decr. Pain with palpation of biceps. Also modified pts standing and sitting posture, and posture when pt is playing card games.  Answered pt questions regarding continuation of therapy and mechanisms for avoiding pain by avoiding abduction while shoulder continues to heal. Pt requested to end PT early as she has minimal problem with shoulder currently.                      PT Education - 09/25/15 1359    Education provided Yes   Education Details d/c from PT with progression of ther ex.   Person(s) Educated Patient   Methods Explanation;Demonstration   Comprehension Verbalized understanding;Returned demonstration             PT Long Term Goals - 09/25/15 1433    PT LONG TERM GOAL #1   Title Pt will lift 10 pounds from hip level to counter height w/ L UE w/o pain to facilitate non painful ADLs such as transporting groceries.    Baseline currently painful lifting above hip level   Time 6   Period Weeks  Status Partially Met   PT LONG TERM GOAL #2   Title Pt will be able to perform relaxed arm swings for 1 min w/o pain to facilitate ideal gait w/o pain   Baseline pain w/ gait/arm swinging   Time 4   Period Weeks   Status Achieved   PT LONG TERM GOAL #3   Title Pt will be able to vacuum her living room w/o shoulder pain to maintain independence   Time 6   Period Weeks   Status Achieved               Plan - 10/07/15 1401    Clinical Impression Statement At this time pt is appropriate for d/c from PT for shoulder pain. She does continue to have difficulty with her shoulder but at this time is having minimal pain. She is able to manage the pain well with  strategies learned in PT. Pt does have apparent biceps tendinopathy which appears to be responding well to light isometrics. PT will continue to monitor pt as she will be seen in the near future for back pain.   Pt will benefit from skilled therapeutic intervention in order to improve on the following deficits Decreased activity tolerance;Decreased mobility;Decreased strength;Impaired UE functional use;Decreased endurance;Decreased range of motion;Difficulty walking;Postural dysfunction   Rehab Potential Good   Clinical Impairments Affecting Rehab Potential age, proir level of activity   PT Frequency 2x / week   PT Duration 6 weeks   PT Treatment/Interventions Therapeutic exercise;Therapeutic activities;Functional mobility training;Neuromuscular re-education;Patient/family education;Manual techniques;Passive range of motion   PT Next Visit Plan Repeat arm bike as decreases pain. Continue with strengthening and manual techniques for L shoulder   PT Home Exercise Plan IR/ER isometrics, encouraged to continue as prescribed.    Consulted and Agree with Plan of Care Patient          G-Codes - Oct 07, 2015 1434    Functional Assessment Tool Used NPRS, QuickDASH   Functional Limitation Carrying, moving and handling objects   Carrying, Moving and Handling Objects Discharge Status [phone]) At least 1 percent but less than 20 percent impaired, limited or restricted      Problem List Patient Active Problem List   Diagnosis Date Noted  . Osteoporosis 01/08/2015  . Arthritis 01/03/2014  . CAFL (chronic airflow limitation) ([place]) 01/03/2014  . HLD (hyperlipidemia) 01/03/2014    Fisher,Benjamin PT DPT 10-07-15, 2:40 PM  [place] PHYSICAL AND SPORTS MEDICINE [address], [place], [address] Phone: [phone]   Fax:  [phone]  Name: Joyce Armstrong MRN: [id number] Date of Birth: 11-Nov-1940

## 2015-09-29 ENCOUNTER — Ambulatory Visit: Payer: PPO | Admitting: Physical Therapy

## 2015-09-29 ENCOUNTER — Encounter: Payer: Self-pay | Admitting: Physical Therapy

## 2015-09-29 DIAGNOSIS — M545 Low back pain: Principal | ICD-10-CM

## 2015-09-29 DIAGNOSIS — M25659 Stiffness of unspecified hip, not elsewhere classified: Secondary | ICD-10-CM

## 2015-09-29 DIAGNOSIS — M25512 Pain in left shoulder: Secondary | ICD-10-CM | POA: Diagnosis not present

## 2015-09-29 DIAGNOSIS — M25551 Pain in right hip: Secondary | ICD-10-CM

## 2015-09-29 DIAGNOSIS — M25552 Pain in left hip: Secondary | ICD-10-CM

## 2015-09-29 DIAGNOSIS — G8929 Other chronic pain: Secondary | ICD-10-CM

## 2015-09-29 NOTE — Therapy (Signed)
[place] [facility] MEDIC[facility] [address] [place], [place], [address] Phone: [phone]   Fax:  [phone]  Physical Therapy Evaluation  Patient Details  Name: Joyce Armstrong MRN: [id number] Date of Birth: 13-Jan-1941 Referring Provider: Freddrick Marchaddell  Encounter Date: 09/29/2015      PT End of Session - 09/29/15 1459    Visit Number 1   Number of Visits 13   Date for PT Re-Evaluation 12/22/15   PT Start Time 1400   PT Stop Time 1445   PT Time Calculation (min) 45 min   Activity Tolerance Patient tolerated treatment well   Behavior During Therapy [facility] for tasks assessed/performed      Past Medical History  Diagnosis Date  . Osteoporosis   . GERD (gastroesophageal reflux disease)   . Thickened endometrium     s/p hysteroscopy d&c on 01/21/2014 benign endometrium and no endometrial masses. will repeat u/s in 6 months to reassess lining and congestion.  Marland Kitchen. COPD (chronic obstructive pulmonary disease) (HCC)   . Hyperlipidemia   . UTI (urinary tract infection)   . Pelvic congestion     incidental finding of left adnexa vascular congestion on ct and pelvis ultrasound and possible left adnexal cyst    Past Surgical History  Procedure Laterality Date  . Cosmetic surgery  1992  . Breast biopsy Right 2009    neg    There were no vitals filed for this visit.  Visit Diagnosis:  Chronic bilateral low back pain, with sciatica presence unspecified  Hip stiffness, unspecified laterality  Hip pain, bilateral      Subjective Assessment - 09/29/15 1357    Subjective Pt reports she has diagnosis of DDD, she has been seeing her MD for at least several years for back/LE pain. Pain is bad in the morning requiring pt to "hobble" when she walks. If pt sits and does not move she has no pain. Pain worsens at the end of the day "when my tylenol wears off". Pt also has ache in B inguinal region.    Pertinent History 2-3 yrs of LBP and hip pain.  Pain worsened in September with no known MOI.   Limitations Lifting;Standing;Walking   How long can you stand comfortably? pain initially but this eases off.   How long can you walk comfortably? 20-30  min walking causes pain.   Diagnostic tests DDD   Patient Stated Goals Pt would like to be able to "trim her toenails", walk.   Currently in Pain? Yes   Pain Score 3    Pain Location Back   Pain Orientation Right;Lower   Pain Descriptors / Indicators Burning            [facility] PT Assessment - 09/29/15 0001    Assessment   Referring Provider Caddell   Onset Date/Surgical Date 07/19/14   Prior Therapy none for this   Precautions   Precautions None   Restrictions   Weight Bearing Restrictions No   Balance Screen   Has the patient fallen in the past 6 months No   Has the patient had a decrease in activity level because of a fear of falling?  No   Is the patient reluctant to leave their home because of a fear of falling?  No   Home [employer] managernvironment   Living Environment Private residence   Prior Function   Level of Independence Independent   Vocation Retired   Leisure playing cards, sitting and playing games   Posture/Postural  Control   Posture Comments anterior pelvic tilt, incr. lordosis   ROM / Strength   AROM / PROM / Strength AROM;PROM;Strength   AROM   Overall AROM Comments lumbar flexion is WNL with incr. pain. extension highly limited and abolishes pain. R rotation produces pain in R hip, No pain with L rotation and movement is WNL.    PROM   Overall PROM Comments B hip IR 5 deg. with pain. SLR to 35 deg. IR on R limited to 15 deg. with reproduction of pain. knee to chest produced pain in inguinal region.    Strength   Overall Strength Comments noted difficulty with low level TA/core activation. Pain with SLR, improved with TA contraction.    Palpation   Palpation comment Deferred spinal palpation for next session   Transfers   Five time sit to stand comments  28 sec. UE  support   Comments requires cuing   Ambulation/Gait   Gait Comments Pt demonstrates very poor core control leaning indicating + trendelenburg          Objective: hooklying knee flops 3x2 min with extensive cuing on how to perform avoiding pain, initially pt had pain in both adductors and abductors with performance of this, with cuing to minmize excursion improved.  TA contractions 3x10 performances with cuing for breathing.  Attempted SLR but unable to perform.  Pelvic tilts - extensive practice in order to perform, requires hooklying for performance of this. Attempted to perform in standing but unable to perform.   Answered multiple pt questions regarding performance of HEP.                 PT Education - 09/29/15 1458    Education provided Yes   Education Details TA contraction, pelvic tilt, knee flops   Person(s) Educated Patient   Methods Explanation;Tactile cues;Verbal cues   Comprehension Verbalized understanding             PT Long Term Goals - 09/29/15 1506    PT LONG TERM GOAL #1   Title Pt will be able to perform SLR with proper TA contraction indicative of improved core control.   Baseline Currently pt has moderate pain with SLR and no activation of core.   Time 6   Period Weeks   Status New   PT LONG TERM GOAL #2   Title Pt will improve hip IR to 25 deg. indicating improvement in hip control/compensation.   Time 6   Period Weeks   Status New   PT LONG TERM GOAL #3   Title Pt will be able to stand and walk pain free for 30 min to be able to shop without pain   Baseline pain at 20 min   Time 6   Period Weeks   Status New               Plan - 09/29/15 1500    Clinical Impression Statement Pt is a pleasant 75 y/o female with c/o chronic 3 yr h/o back pain, inguinal region pain, and pain in a sciatic nerve distribution. Pt currently presents with pain, muscle weakness, impaired gait, and hypomobility. Pt would benefit from skilled  PT to address these issues.   Pt will benefit from skilled therapeutic intervention in order to improve on the following deficits Improper body mechanics;Decreased range of motion;Impaired flexibility;Decreased mobility;Decreased strength;Pain   Rehab Potential Good   Clinical Impairments Affecting Rehab Potential age, prior level of activity, chronicity of pain, motivation   PT  Frequency 2x / week   PT Duration 6 weeks   PT Treatment/Interventions Aquatic Therapy;Manual techniques;Therapeutic exercise;Therapeutic activities;ADLs/Self Care Home Management;Dry needling   Consulted and Agree with Plan of Care Patient          G-Codes - October 10, 2015 1515    Functional Assessment Tool Used hip ROM, squat   Functional Limitation Mobility: Walking and moving around   Mobility: Walking and Moving Around Current Status [phone]) At least 20 percent but less than 40 percent impaired, limited or restricted   Mobility: Walking and Moving Around Goal Status [phone]) At least 1 percent but less than 20 percent impaired, limited or restricted       Problem List Patient Active Problem List   Diagnosis Date Noted  . Osteoporosis 01/08/2015  . Arthritis 01/03/2014  . CAFL (chronic airflow limitation) (HCC) 01/03/2014  . HLD (hyperlipidemia) 01/03/2014    Fisher,Benjamin PT DPT Oct 10, 2015, 3:19 PM  [place] [facility] [address], [place], [address] Phone: [phone]   Fax:  [phone]  Name: Joyce Armstrong MRN: [id number] Date of Birth: [birth year]

## 2015-10-02 ENCOUNTER — Ambulatory Visit: Payer: PPO | Admitting: Physical Therapy

## 2015-10-02 DIAGNOSIS — G8929 Other chronic pain: Secondary | ICD-10-CM

## 2015-10-02 DIAGNOSIS — M25512 Pain in left shoulder: Secondary | ICD-10-CM | POA: Diagnosis not present

## 2015-10-02 DIAGNOSIS — M545 Low back pain: Secondary | ICD-10-CM

## 2015-10-02 DIAGNOSIS — M25659 Stiffness of unspecified hip, not elsewhere classified: Secondary | ICD-10-CM

## 2015-10-02 NOTE — Therapy (Signed)
[place] [facility] [address], [place], [address] Phone: [phone]   Fax:  [phone]  Physical Therapy Treatment  Patient Details  Name: Joyce Armstrong MRN: [id number] Date of Birth: [birth year] Referring Provider: Freddrick March  Encounter Date: 10/02/2015      PT End of Session - 10/02/15 1224    Visit Number 2   Number of Visits 13   Date for PT Re-Evaluation 12/22/15   PT Start Time 1112   PT Stop Time 1150   PT Time Calculation (min) 38 min   Activity Tolerance Patient tolerated treatment well   Behavior During Therapy [facility] for tasks assessed/performed      Past Medical History  Diagnosis Date  . Osteoporosis   . GERD (gastroesophageal reflux disease)   . Thickened endometrium     s/p hysteroscopy d&c on 01/21/2014 benign endometrium and no endometrial masses. will repeat u/s in 6 months to reassess lining and congestion.  Marland Kitchen COPD (chronic obstructive pulmonary disease) (HCC)   . Hyperlipidemia   . UTI (urinary tract infection)   . Pelvic congestion     incidental finding of left adnexa vascular congestion on ct and pelvis ultrasound and possible left adnexal cyst    Past Surgical History  Procedure Laterality Date  . Cosmetic surgery  1992  . Breast biopsy Right 2009    neg    There were no vitals filed for this visit.  Visit Diagnosis:  Hip stiffness, unspecified laterality  Chronic bilateral low back pain, with sciatica presence unspecified      Subjective Assessment - 10/02/15 1119    Subjective Pt reports she is feeling better today. she had a very sore day yesterday. inconsistent performance of HEP.   Pertinent History 2-3 yrs of LBP and hip pain. Pain worsened in September with no known MOI.   Limitations Lifting;Standing;Walking   How long can you stand comfortably? pain initially but this eases off.   How long can you walk comfortably? 20-30  min walking causes pain.   Diagnostic  tests DDD   Patient Stated Goals Pt would like to be able to "trim her toenails", walk.   Currently in Pain? No/denies   Pain Score 0-No pain              Objective: HS stretch, piriformis stretch, IR stretch, each performed 3x1 min B.  Following this issued and had pt perform piriformis stretch. When she attempted this had significant spasm in R paraspinals.  Seated ball rolling 3x2 min. This improved spasm considerably.  SL STM performed on R paraspinals.  Issued new HEP of ball rolling, defered piriformis stretch due to spasm.                   PT Education - 10/02/15 1123    Education provided Yes   Education Details stretching for glute med, paraspinals   Person(s) Educated Patient   Methods Explanation   Comprehension Verbalized understanding             PT Long Term Goals - 09/29/15 1506    PT LONG TERM GOAL #1   Title Pt will be able to perform SLR with proper TA contraction indicative of improved core control.   Baseline Currently pt has moderate pain with SLR and no activation of core.   Time 6   Period Weeks   Status New   PT LONG TERM GOAL #2   Title Pt will improve hip  IR to 25 deg. indicating improvement in hip control/compensation.   Time 6   Period Weeks   Status New   PT LONG TERM GOAL #3   Title Pt will be able to stand and walk pain free for 30 min to be able to shop without pain   Baseline pain at 20 min   Time 6   Period Weeks   Status New               Plan - 10/02/15 1225    Clinical Impression Statement pain improved considerably for pt within session, appears to have pain definitely related to her significant muscular hypomobility in spine and hips. Overall pain is improved, continues to have significant muscle weakness, impaired gait and hypomobility.   Pt will benefit from skilled therapeutic intervention in order to improve on the following deficits Improper body mechanics;Decreased range of  motion;Impaired flexibility;Decreased mobility;Decreased strength;Pain   Rehab Potential Good   Clinical Impairments Affecting Rehab Potential age, prior level of activity, chronicity of pain, motivation   PT Frequency 2x / week   PT Duration 6 weeks   PT Treatment/Interventions Aquatic Therapy;Manual techniques;Therapeutic exercise;Therapeutic activities;ADLs/Self Care Home Management;Dry needling   Consulted and Agree with Plan of Care Patient        Problem List Patient Active Problem List   Diagnosis Date Noted  . Osteoporosis 01/08/2015  . Arthritis 01/03/2014  . CAFL (chronic airflow limitation) (HCC) 01/03/2014  . HLD (hyperlipidemia) 01/03/2014    Fisher,Benjamin PT DPT 10/02/2015, 12:28 PM   [facility] [address] [place], [place], [address] Phone: [phone]   Fax:  [phone]  Name: Joyce Armstrong MRN: [id number] Date of Birth: March 16, 1941

## 2015-10-06 ENCOUNTER — Ambulatory Visit: Payer: PPO | Admitting: Physical Therapy

## 2015-10-06 DIAGNOSIS — G8929 Other chronic pain: Secondary | ICD-10-CM

## 2015-10-06 DIAGNOSIS — M25659 Stiffness of unspecified hip, not elsewhere classified: Secondary | ICD-10-CM

## 2015-10-06 DIAGNOSIS — M25512 Pain in left shoulder: Secondary | ICD-10-CM | POA: Diagnosis not present

## 2015-10-06 DIAGNOSIS — M545 Low back pain: Secondary | ICD-10-CM

## 2015-10-06 NOTE — Therapy (Signed)
[place] [facility] [address] [place], [place], [address] Phone: [phone]   Fax:  [phone]  Physical Therapy Treatment  Patient Details  Name: Joyce Armstrong MRN: [id number] Date of Birth: 01/14/1941 Referring Provider: Freddrick Marchaddell  Encounter Date: 10/06/2015      PT End of Session - 10/06/15 1425    Visit Number 3   Number of Visits 13   Date for PT Re-Evaluation 12/22/15   PT Start Time 1345   PT Stop Time 1425   PT Time Calculation (min) 40 min   Activity Tolerance Patient tolerated treatment well   Behavior During Therapy [facility] for tasks assessed/performed      Past Medical History  Diagnosis Date  . Osteoporosis   . GERD (gastroesophageal reflux disease)   . Thickened endometrium     s/p hysteroscopy d&c on 01/21/2014 benign endometrium and no endometrial masses. will repeat u/s in 6 months to reassess lining and congestion.  Marland Kitchen. COPD (chronic obstructive pulmonary disease) (HCC)   . Hyperlipidemia   . UTI (urinary tract infection)   . Pelvic congestion     incidental finding of left adnexa vascular congestion on ct and pelvis ultrasound and possible left adnexal cyst    Past Surgical History  Procedure Laterality Date  . Cosmetic surgery  1992  . Breast biopsy Right 2009    neg    There were no vitals filed for this visit.  Visit Diagnosis:  Hip stiffness, unspecified laterality  Chronic bilateral low back pain, with sciatica presence unspecified      Subjective Assessment - 10/06/15 1349    Subjective Pt reports she had improvement in pain starting on Sunday. She has been consistent with her HEP. does report improvement in ability to bend forward.   Pertinent History 2-3 yrs of LBP and hip pain. Pain worsened in September with no known MOI.   Limitations Lifting;Standing;Walking   How long can you stand comfortably? pain initially but this eases off.   How long can you walk comfortably? 20-30   min walking causes pain.   Diagnostic tests DDD   Patient Stated Goals Pt would like to be able to "trim her toenails", walk.   Currently in Pain? No/denies   Pain Score 0-No pain              Objective: palloff press 3x15 each side, initially c/o pain with performance of this, improved with cuing to perform posterior pelvic tilt.  TG 3x10 with cuing for neutral spinal position, TA contraction with performance.  White ball forward rolling 3x20 reps. Following this improved pain with sit<>stand to 0/10.  Sit<>stand practice, UE support for standing, none for sit down, some notable "flopping" occurred with this at end range and unable to correct so will work on this at home.  Seated elbows on knees manual intervention: STM performed on R paraspinals x8 min, UPAs performed on R side following this.                     PT Education - 10/06/15 1354    Education provided Yes   Education Details sit<>stand, pelvic control.   Person(s) Educated Patient   Methods Explanation   Comprehension Verbalized understanding             PT Long Term Goals - 09/29/15 1506    PT LONG TERM GOAL #1   Title Pt will be able to perform SLR with proper TA contraction  indicative of improved core control.   Baseline Currently pt has moderate pain with SLR and no activation of core.   Time 6   Period Weeks   Status New   PT LONG TERM GOAL #2   Title Pt will improve hip IR to 25 deg. indicating improvement in hip control/compensation.   Time 6   Period Weeks   Status New   PT LONG TERM GOAL #3   Title Pt will be able to stand and walk pain free for 30 min to be able to shop without pain   Baseline pain at 20 min   Time 6   Period Weeks   Status New               Plan - 10/06/15 1426    Clinical Impression Statement Pt demonstrates difficulty with back/core/pelvic control with use of LE or UE in functional situations, which reproduces her pain. Pt also continues  to demonstrate moderate difficulty/pain with sit<>stand, but at rest has made significant improvement in pain and stiffness. Look to address muscle spasm in R paraspinal region.   Pt will benefit from skilled therapeutic intervention in order to improve on the following deficits Improper body mechanics;Decreased range of motion;Impaired flexibility;Decreased mobility;Decreased strength;Pain   Rehab Potential Good   Clinical Impairments Affecting Rehab Potential age, prior level of activity, chronicity of pain, motivation   PT Frequency 2x / week   PT Duration 6 weeks   PT Treatment/Interventions Aquatic Therapy;Manual techniques;Therapeutic exercise;Therapeutic activities;ADLs/Self Care Home Management;Dry needling   Consulted and Agree with Plan of Care Patient        Problem List Patient Active Problem List   Diagnosis Date Noted  . Osteoporosis 01/08/2015  . Arthritis 01/03/2014  . CAFL (chronic airflow limitation) (HCC) 01/03/2014  . HLD (hyperlipidemia) 01/03/2014    Fisher,Benjamin PT DPT 10/06/2015, 2:34 PM  [place] [facility] [address] [place], [address] Phone: [phone]   Fax:  [phone]  Name: Joyce Armstrong MRN: [id number] Date of Birth: March 29, 1941

## 2015-10-08 ENCOUNTER — Ambulatory Visit: Payer: PPO | Admitting: Physical Therapy

## 2015-10-08 DIAGNOSIS — M25512 Pain in left shoulder: Secondary | ICD-10-CM | POA: Diagnosis not present

## 2015-10-08 DIAGNOSIS — M545 Low back pain: Secondary | ICD-10-CM

## 2015-10-08 DIAGNOSIS — M25659 Stiffness of unspecified hip, not elsewhere classified: Secondary | ICD-10-CM

## 2015-10-08 DIAGNOSIS — G8929 Other chronic pain: Secondary | ICD-10-CM

## 2015-10-09 ENCOUNTER — Encounter: Payer: PPO | Admitting: Physical Therapy

## 2015-10-09 NOTE — Therapy (Signed)
[place] [facility] [address], [place], [address] Phone: [phone]   Fax:  [phone]  Physical Therapy Treatment  Patient Details  Name: Sevanna Ballengee MRN: [id number] Date of Birth: 1940-12-23 Referring Provider: Freddrick March  Encounter Date: 10/08/2015      PT End of Session - 10/08/15 0643    Visit Number 4   Number of Visits 13   Date for PT Re-Evaluation 12/22/15   PT Start Time 0945   PT Stop Time 1030   PT Time Calculation (min) 45 min   Activity Tolerance Patient tolerated treatment well   Behavior During Therapy [facility] for tasks assessed/performed      Past Medical History  Diagnosis Date  . Osteoporosis   . GERD (gastroesophageal reflux disease)   . Thickened endometrium     s/p hysteroscopy d&c on 01/21/2014 benign endometrium and no endometrial masses. will repeat u/s in 6 months to reassess lining and congestion.  Marland Kitchen COPD (chronic obstructive pulmonary disease) (HCC)   . Hyperlipidemia   . UTI (urinary tract infection)   . Pelvic congestion     incidental finding of left adnexa vascular congestion on ct and pelvis ultrasound and possible left adnexal cyst    Past Surgical History  Procedure Laterality Date  . Cosmetic surgery  1992  . Breast biopsy Right 2009    neg    There were no vitals filed for this visit.  Visit Diagnosis:  Hip stiffness, unspecified laterality  Chronic bilateral low back pain, with sciatica presence unspecified      Subjective Assessment - 10/08/15 0949    Subjective Pt reports she has had high degree of pain the next day following previous PT session.   Pertinent History 2-3 yrs of LBP and hip pain. Pain worsened in September with no known MOI.   Limitations Lifting;Standing;Walking   How long can you stand comfortably? pain initially but this eases off.   How long can you walk comfortably? 20-30  min walking causes pain.   Diagnostic tests DDD   Patient  Stated Goals Pt would like to be able to "trim her toenails", walk.   Currently in Pain? Yes   Pain Score 4    Pain Location Back            Objective: Stair lunges x10 min total. Initially pt c/o mild R knee pain but with correction to avoid valgus this improved. Initially with palpation pt demonstrated signfiicant back spasm which also improved with cuing for TA contraction and upright posture. Pt reported this exercise to be very difficult/fatiguing on LE.  Palloff press GTB 3x10 each side. Required decr. Cuing for performance of this.  Assessed squat, standing walk lunge, deferred reaching component as walk lunge (narrow lunge) was difficult for pt.   Forward lumbar flexion rolling on white ball x20 with deep breathing at end range.  Elbows on knees in sitting position performed STM on R paraspinals, erectors. Assessed UPAs but at this time though hypomobile they produced no pain so focused on STM. Following this pt able to perform sit<>stand pain free.                     PT Education - 10/08/15 [phone]    Education provided Yes   Education Details educated on the need for stronger LE to minimize back compensation   Person(s) Educated Patient   Methods Explanation   Comprehension Verbalized understanding  PT Long Term Goals - 09/29/15 1506    PT LONG TERM GOAL #1   Title Pt will be able to perform SLR with proper TA contraction indicative of improved core control.   Baseline Currently pt has moderate pain with SLR and no activation of core.   Time 6   Period Weeks   Status New   PT LONG TERM GOAL #2   Title Pt will improve hip IR to 25 deg. indicating improvement in hip control/compensation.   Time 6   Period Weeks   Status New   PT LONG TERM GOAL #3   Title Pt will be able to stand and walk pain free for 30 min to be able to shop without pain   Baseline pain at 20 min   Time 6   Period Weeks   Status New                Plan - 10/08/15 [id number]    Clinical Impression Statement Improvement overall in pain, however significant weakness in hips does lead pt to compensate with back extensor activation with most low to moderate level activities (sit<.stand, lunge, picking up objects from the floor) and benefited within session from focus on strengthening LE while also keeping back relaxed. Will require additional emphasis on this.   Pt will benefit from skilled therapeutic intervention in order to improve on the following deficits Improper body mechanics;Decreased range of motion;Impaired flexibility;Decreased mobility;Decreased strength;Pain   Rehab Potential Good   Clinical Impairments Affecting Rehab Potential age, prior level of activity, chronicity of pain, motivation   PT Frequency 2x / week   PT Duration 6 weeks   PT Treatment/Interventions Aquatic Therapy;Manual techniques;Therapeutic exercise;Therapeutic activities;ADLs/Self Care Home Management;Dry needling   Consulted and Agree with Plan of Care Patient        Problem List Patient Active Problem List   Diagnosis Date Noted  . Osteoporosis 01/08/2015  . Arthritis 01/03/2014  . CAFL (chronic airflow limitation) (HCC) 01/03/2014  . HLD (hyperlipidemia) 01/03/2014    Fisher,Benjamin PT DPT 10/09/2015, 6:47 AM  [place] [facility] [address] [place], [place], [address] Phone: [phone]   Fax:  [phone]  Name: Abelina Bachelorlla Rae Wheller MRN: [id number] Date of Birth: [birth year]

## 2015-10-16 ENCOUNTER — Encounter: Payer: Self-pay | Admitting: Physical Therapy

## 2015-10-16 ENCOUNTER — Ambulatory Visit: Payer: PPO

## 2015-10-16 DIAGNOSIS — M25551 Pain in right hip: Secondary | ICD-10-CM

## 2015-10-16 DIAGNOSIS — G8929 Other chronic pain: Secondary | ICD-10-CM

## 2015-10-16 DIAGNOSIS — M545 Low back pain: Principal | ICD-10-CM

## 2015-10-16 DIAGNOSIS — M25552 Pain in left hip: Secondary | ICD-10-CM

## 2015-10-16 DIAGNOSIS — M25512 Pain in left shoulder: Secondary | ICD-10-CM | POA: Diagnosis not present

## 2015-10-16 DIAGNOSIS — M25659 Stiffness of unspecified hip, not elsewhere classified: Secondary | ICD-10-CM

## 2015-10-16 NOTE — Therapy (Signed)
[facility] [address] [place], [place], [address] Phone: [phone]   Fax:  [phone]  Physical Therapy Treatment  Patient Details  Name: Joyce Armstrong MRN: [id number] Date of Birth: [birth year] Referring Provider: Freddrick Marchaddell  Encounter Date: 10/16/2015      PT End of Session - 10/16/15 1300    Visit Number 5   Number of Visits 13   Date for PT Re-Evaluation 12/22/15   PT Start Time 1300   PT Stop Time 1345   PT Time Calculation (min) 45 min   Activity Tolerance Patient tolerated treatment well   Behavior During Therapy [facility] for tasks assessed/performed      Past Medical History  Diagnosis Date  . Osteoporosis   . GERD (gastroesophageal reflux disease)   . Thickened endometrium     s/p hysteroscopy d&c on 01/21/2014 benign endometrium and no endometrial masses. will repeat u/s in 6 months to reassess lining and congestion.  Marland Kitchen. COPD (chronic obstructive pulmonary disease) (HCC)   . Hyperlipidemia   . UTI (urinary tract infection)   . Pelvic congestion     incidental finding of left adnexa vascular congestion on ct and pelvis ultrasound and possible left adnexal cyst    Past Surgical History  Procedure Laterality Date  . Cosmetic surgery  1992  . Breast biopsy Right 2009    neg    There were no vitals filed for this visit.  Visit Diagnosis:  Chronic bilateral low back pain, with sciatica presence unspecified  Hip pain, bilateral  Hip stiffness, unspecified laterality      Subjective Assessment - 10/16/15 1258    Subjective Pt reports that her back is hurting today. She had to run errands this morning and was unable to perform her exercises. Pt reports 5/10 low back pain on this date. No specific questions or concerns at this time   Pertinent History 2-3 yrs of LBP and hip pain. Pain worsened in September with no known MOI.   Limitations Lifting;Standing;Walking   How long can you stand  comfortably? pain initially but this eases off.   How long can you walk comfortably? 20-30  min walking causes pain.   Diagnostic tests DDD   Patient Stated Goals Pt would like to be able to "trim her toenails", walk.   Currently in Pain? Yes   Pain Score 5    Pain Location Back   Pain Orientation Right;Left   Aggravating Factors  Bending forward       Objective:  NuStep L1 x 3 minutes for warm-up during history; Hooklying diaphragmatic breathing to relax paraspinals/erectors prior to movement; Hooklying pelvic tilts ant/post with coordinated breathing x 10 each; Hooklying knee flops x 10 each side; Passive single knee to chest stretch 10-15 second hold x 5 bilateral; Log rolling training with repetition with discussion regarding avoiding lumbar strain and excessive rotation; Forward lumbar flexion rolling on white ball x 20 with deep breathing at end range. Palloff press GTB 3x10 each side. Required decr. Cuing for performance of this. Stair lunges x 5 minutes total alternating LE, pt denies fatigue; Forward lunges with opposite UE flexion 2 x 10 bilateral; Standing heel raises x 10 bilateral; Standing squats 2 x 10 with heavy cues for proper form/technique;                            PT Education - 10/16/15 1259    Education provided  Yes   Education Details HEP reinforced   Person(s) Educated Patient   Methods Explanation   Comprehension Verbalized understanding             PT Long Term Goals - 09/29/15 1506    PT LONG TERM GOAL #1   Title Pt will be able to perform SLR with proper TA contraction indicative of improved core control.   Baseline Currently pt has moderate pain with SLR and no activation of core.   Time 6   Period Weeks   Status New   PT LONG TERM GOAL #2   Title Pt will improve hip IR to 25 deg. indicating improvement in hip control/compensation.   Time 6   Period Weeks   Status New   PT LONG TERM GOAL #3   Title Pt will  be able to stand and walk pain free for 30 min to be able to shop without pain   Baseline pain at 20 min   Time 6   Period Weeks   Status New               Plan - 10/16/15 1300    Clinical Impression Statement Pt reports improving back pain at the end of session. She has significant hip and LE weakness reporting fear of buckling with squats and lunges. She struggles to hinge at the hips with squats as well as when picking up objects from the floor. Pt also struggles with log rolling techniqe. Encouraged continue LE strengthening as well as proper biomechanics with lifting, sitting, and rolling in bed. Pt may benefit from lift training for proper form/technique to avoid excessive lumbar flexion. .Pt encouraged to continue HEP and follow-up as scheduled.    Pt will benefit from skilled therapeutic intervention in order to improve on the following deficits Improper body mechanics;Decreased range of motion;Impaired flexibility;Decreased mobility;Decreased strength;Pain   Rehab Potential Good   Clinical Impairments Affecting Rehab Potential age, prior level of activity, chronicity of pain, motivation   PT Frequency 2x / week   PT Duration 6 weeks   PT Treatment/Interventions Aquatic Therapy;Manual techniques;Therapeutic exercise;Therapeutic activities;ADLs/Self Care Home Management;Dry needling   PT Next Visit Plan Work on lifting weight with proper form from hip and knee height. Continue squats and lunges   PT Home Exercise Plan Continue as prescribed   Consulted and Agree with Plan of Care Patient        Problem List Patient Active Problem List   Diagnosis Date Noted  . Osteoporosis 01/08/2015  . Arthritis 01/03/2014  . CAFL (chronic airflow limitation) (HCC) 01/03/2014  . HLD (hyperlipidemia) 01/03/2014   Lynnea Maizes PT, DPT   Huprich,Jason 10/16/2015, 4:07 PM  [place] [facility]TS [facility]IC[facility] [address], [place],  [address] Phone: [phone]   Fax:  [phone]  Name: Senta Kantor MRN: [id number] Date of Birth: 11-29-1940

## 2015-10-20 ENCOUNTER — Ambulatory Visit: Payer: PPO | Attending: Neurosurgery | Admitting: Physical Therapy

## 2015-10-20 DIAGNOSIS — M5442 Lumbago with sciatica, left side: Secondary | ICD-10-CM | POA: Insufficient documentation

## 2015-10-20 DIAGNOSIS — M25652 Stiffness of left hip, not elsewhere classified: Secondary | ICD-10-CM | POA: Insufficient documentation

## 2015-10-20 DIAGNOSIS — M5441 Lumbago with sciatica, right side: Secondary | ICD-10-CM | POA: Insufficient documentation

## 2015-10-20 DIAGNOSIS — M25651 Stiffness of right hip, not elsewhere classified: Secondary | ICD-10-CM | POA: Insufficient documentation

## 2015-10-20 DIAGNOSIS — M545 Low back pain: Secondary | ICD-10-CM | POA: Insufficient documentation

## 2015-10-20 DIAGNOSIS — G8929 Other chronic pain: Secondary | ICD-10-CM | POA: Insufficient documentation

## 2015-10-23 ENCOUNTER — Ambulatory Visit: Payer: PPO | Admitting: Physical Therapy

## 2015-10-23 DIAGNOSIS — M25652 Stiffness of left hip, not elsewhere classified: Secondary | ICD-10-CM | POA: Diagnosis present

## 2015-10-23 DIAGNOSIS — M545 Low back pain: Secondary | ICD-10-CM | POA: Diagnosis present

## 2015-10-23 DIAGNOSIS — M25651 Stiffness of right hip, not elsewhere classified: Secondary | ICD-10-CM | POA: Diagnosis present

## 2015-10-23 DIAGNOSIS — G8929 Other chronic pain: Secondary | ICD-10-CM

## 2015-10-23 DIAGNOSIS — M5442 Lumbago with sciatica, left side: Secondary | ICD-10-CM | POA: Diagnosis present

## 2015-10-23 DIAGNOSIS — M5441 Lumbago with sciatica, right side: Secondary | ICD-10-CM | POA: Diagnosis not present

## 2015-10-23 NOTE — Therapy (Signed)
[place] [facility] [address] [place], [address] Phone: [phone]   Fax:  [phone]  Physical Therapy Treatment  Patient Details  Name: Joyce Armstrong MRN: [id number] Date of Birth: 10-17-1940 Referring Provider: Freddrick March  Encounter Date: 10/23/2015      PT End of Session - 10/23/15 1453    Visit Number 6   Number of Visits 13   Date for PT Re-Evaluation 12/22/15   PT Start Time 1430   PT Stop Time 1510   PT Time Calculation (min) 40 min   Activity Tolerance Patient tolerated treatment well   Behavior During Therapy [facility] for tasks assessed/performed      Past Medical History  Diagnosis Date  . Osteoporosis   . GERD (gastroesophageal reflux disease)   . Thickened endometrium     s/p hysteroscopy d&c on 01/21/2014 benign endometrium and no endometrial masses. will repeat u/s in 6 months to reassess lining and congestion.  Marland Kitchen COPD (chronic obstructive pulmonary disease) (HCC)   . Hyperlipidemia   . UTI (urinary tract infection)   . Pelvic congestion     incidental finding of left adnexa vascular congestion on ct and pelvis ultrasound and possible left adnexal cyst    Past Surgical History  Procedure Laterality Date  . Cosmetic surgery  1992  . Breast biopsy Right 2009    neg    There were no vitals filed for this visit.  Visit Diagnosis:  Chronic bilateral low back pain, with sciatica presence unspecified      Subjective Assessment - 10/23/15 1430    Subjective Pt reports she is in a very high degree of pain, and has been for the past week.   Pertinent History 2-3 yrs of LBP and hip pain. Pain worsened in September with no known MOI.   Limitations Lifting;Standing;Walking   How long can you stand comfortably? pain initially but this eases off.   How long can you walk comfortably? 20-30  min walking causes pain.   Diagnostic tests DDD   Patient Stated Goals Pt would like to be able to "trim  her toenails", walk.   Currently in Pain? Yes   Pain Score 8    Pain Location Back              Objective: Rolling forward/back on ball 3x20, spinal twists 3x20, rocking forward/back 3x20 to activate back.  Following this pt reported resolution of pain.  Wt shifting in neutral spine to activate back, pt reported improvement in back stiffness but LE pain.  PT educated pt on the importance of continuing with HEP and overall activity at home, pt reports she has not done so but would like to try aquatic therapy.  Seated paraspinal STM performed. Initially had very high degree of pain with this, following this pt able to tolerate incr. Pressure with STM.  Reviewed HEP to ensure pt is understanding of and will perform her exercises.                   PT Education - 10/23/15 1452    Education provided Yes   Education Details HEP   Person(s) Educated Patient   Methods Explanation   Comprehension Verbalized understanding             PT Long Term Goals - 09/29/15 1506    PT LONG TERM GOAL #1   Title Pt will be able to perform SLR with proper TA contraction indicative of improved  core control.   Baseline Currently pt has moderate pain with SLR and no activation of core.   Time 6   Period Weeks   Status New   PT LONG TERM GOAL #2   Title Pt will improve hip IR to 25 deg. indicating improvement in hip control/compensation.   Time 6   Period Weeks   Status New   PT LONG TERM GOAL #3   Title Pt will be able to stand and walk pain free for 30 min to be able to shop without pain   Baseline pain at 20 min   Time 6   Period Weeks   Status New               Plan - 10/23/15 1453    Clinical Impression Statement Back pain is significantly improved. Required extensive cuing throughout session to participate in exercise. Pt c/o pain in legs throughout session which is due to significant muscle weakness. Throughout session PT encouraged pt to become more  consistent with her HEP and educated pt on pool, will have pt attend 1-2 pool sessions to learn what she needs to do at community pool.   Pt will benefit from skilled therapeutic intervention in order to improve on the following deficits Improper body mechanics;Decreased range of motion;Impaired flexibility;Decreased mobility;Decreased strength;Pain   Rehab Potential Good   Clinical Impairments Affecting Rehab Potential age, prior level of activity, chronicity of pain, motivation   PT Frequency 2x / week   PT Duration 6 weeks   PT Treatment/Interventions Aquatic Therapy;Manual techniques;Therapeutic exercise;Therapeutic activities;ADLs/Self Care Home Management;Dry needling   PT Next Visit Plan Work on lifting weight with proper form from hip and knee height. Continue squats and lunges   PT Home Exercise Plan Continue as prescribed   Consulted and Agree with Plan of Care Patient        Problem List Patient Active Problem List   Diagnosis Date Noted  . Osteoporosis 01/08/2015  . Arthritis 01/03/2014  . CAFL (chronic airflow limitation) (HCC) 01/03/2014  . HLD (hyperlipidemia) 01/03/2014    Fisher,Benjamin PT DPT 10/23/2015, 3:32 PM  [place] [facility] [address] [place], [place], [address] Phone: [phone]   Fax:  [phone]  Name: Joyce Armstrong MRN: [id number] Date of Birth: Mar 24, 1941

## 2015-10-27 ENCOUNTER — Ambulatory Visit: Payer: PPO | Admitting: Physical Therapy

## 2015-10-28 ENCOUNTER — Ambulatory Visit: Payer: PPO | Admitting: Physical Therapy

## 2015-10-28 DIAGNOSIS — M5441 Lumbago with sciatica, right side: Secondary | ICD-10-CM

## 2015-10-28 DIAGNOSIS — M25651 Stiffness of right hip, not elsewhere classified: Secondary | ICD-10-CM

## 2015-10-28 DIAGNOSIS — M5442 Lumbago with sciatica, left side: Principal | ICD-10-CM

## 2015-10-28 DIAGNOSIS — M25652 Stiffness of left hip, not elsewhere classified: Secondary | ICD-10-CM
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
## 2015-10-30 ENCOUNTER — Ambulatory Visit: Payer: PPO | Admitting: Physical Therapy

## 2015-10-30 DIAGNOSIS — M5441 Lumbago with sciatica, right side: Secondary | ICD-10-CM

## 2015-10-30 DIAGNOSIS — M5442 Lumbago with sciatica, left side: Principal | ICD-10-CM

## 2015-10-30 NOTE — Addendum Note (Signed)
Addended by: Mariane MastersYEUNG, SHIN-YIING on: 10/30/2015 04:51 PM   Modules accepted: Orders

## 2015-10-30 NOTE — Therapy (Signed)
[facility] [address], [place], [address] Phone: [phone]   Fax:  [phone]  Physical Therapy Treatment  Patient Details  Name: Joyce Armstrong MRN: [id number] Date of Birth: July 24, 1940 Referring Provider: Freddrick March  Encounter Date: 10/30/2015      PT End of Session - 10/30/15 1555    Visit Number 8   Number of Visits 13   Date for PT Re-Evaluation 12/22/15   PT Start Time 1515   PT Stop Time 1555   PT Time Calculation (min) 40 min   Activity Tolerance Patient tolerated treatment well;No increased pain   Behavior During Therapy [facility] for tasks assessed/performed      Past Medical History  Diagnosis Date  . Osteoporosis   . GERD (gastroesophageal reflux disease)   . Thickened endometrium     s/p hysteroscopy d&c on 01/21/2014 benign endometrium and no endometrial masses. will repeat u/s in 6 months to reassess lining and congestion.  Marland Kitchen COPD (chronic obstructive pulmonary disease) (HCC)   . Hyperlipidemia   . UTI (urinary tract infection)   . Pelvic congestion     incidental finding of left adnexa vascular congestion on ct and pelvis ultrasound and possible left adnexal cyst    Past Surgical History  Procedure Laterality Date  . Cosmetic surgery  1992  . Breast biopsy Right 2009    neg    There were no vitals filed for this visit.      Subjective Assessment - 10/30/15 1511    Subjective Pt reports she felt very good Tuesday after the pool. The day after she had incr. pain. Today pain is worsened.   Pertinent History 2-3 yrs of LBP and hip pain. Pain worsened in September with no known MOI.   Limitations Lifting;Standing;Walking   How long can you stand comfortably? pain initially but this eases off.   How long can you walk comfortably? 20-30  min walking causes pain.   Diagnostic tests DDD   Patient Stated Goals Pt would like to be able to "trim her toenails", walk.   Currently in  Pain? Yes   Pain Score 6    Pain Location Back           Objective: Supine, deep breathing focusing on full exhale with trunk/TA activation. Knee flops - pt performs too quickly so required cuing to slow down, tie in with breath. 3x20. Knee open books - also performed 3x20, answered pt questions regarding performance of this. Single leg knee dropouts, 3x10 each side, performed slowly.  Seated forward flexion with deep breathing to relax back x10 slow breaths.  amb in clinic - following this pt reported 0/10 pain.  Nu-step L3 x4 min with manual cuing for spinal twist.  4 additional min (no charge) as pt wanted to "push herself".                  PT Education - 10/30/15 1555    Education provided Yes   Education Details HEP, end of PT in the near future.   Person(s) Educated Patient   Methods Explanation   Comprehension Verbalized understanding             PT Long Term Goals - 10/28/15 2230    PT LONG TERM GOAL #1   Title Pt will be able to perform SLR with proper TA contraction indicative of improved core control.   Baseline Currently pt has moderate pain with SLR and no activation  of core.   Time 6   Period Weeks   Status On-going   PT LONG TERM GOAL #2   Title Pt will improve hip IR to 25 deg. indicating improvement in hip control/compensation.   Time 6   Period Weeks   Status On-going   PT LONG TERM GOAL #3   Title Pt will be able to stand and walk pain free for 30 min to be able to shop without pain   Baseline pain at 20 min   Time 6   Period Weeks   Status On-going               Plan - 10/30/15 1556    Clinical Impression Statement Pt made definite within session improvement with exercise. She continues to have pain when she is inactive, which is most of the time. She has for the  most part plateaued with her response to PT due to inactivity, would beneit from one-two additional sessions in the pool with one followup to ensure she  is confident with routine to be done at home.    Rehab Potential Good   Clinical Impairments Affecting Rehab Potential age, prior level of activity, chronicity of pain, motivation   PT Frequency 2x / week   PT Duration 6 weeks   PT Treatment/Interventions Aquatic Therapy;Manual techniques;Therapeutic exercise;Therapeutic activities;ADLs/Self Care Home Management;Dry needling   PT Next Visit Plan Work on lifting weight with proper form from hip and knee height. Continue squats and lunges   PT Home Exercise Plan Continue as prescribed   Consulted and Agree with Plan of Care Patient      Patient will benefit from skilled therapeutic intervention in order to improve the following deficits and impairments:  Improper body mechanics, Decreased range of motion, Impaired flexibility, Decreased mobility, Decreased strength, Pain  Visit Diagnosis: Bilateral low back pain with sciatica, sciatica laterality unspecified     Problem List Patient Active Problem List   Diagnosis Date Noted  . Osteoporosis 01/08/2015  . Arthritis 01/03/2014  . CAFL (chronic airflow limitation) (HCC) 01/03/2014  . HLD (hyperlipidemia) 01/03/2014    Burney Calzadilla PT DPT 10/30/2015, 4:02 PM  [facility] [address] [place], [place], [address] Phone: [phone]   Fax:  [phone]  Name: Joyce Armstrong MRN: [id number] Date of Birth: [birth year]

## 2015-11-04 ENCOUNTER — Ambulatory Visit: Payer: PPO | Admitting: Physical Therapy

## 2015-11-04 DIAGNOSIS — M5442 Lumbago with sciatica, left side: Principal | ICD-10-CM

## 2015-11-04 DIAGNOSIS — M5441 Lumbago with sciatica, right side: Secondary | ICD-10-CM | POA: Diagnosis not present

## 2015-11-04 DIAGNOSIS — M25652 Stiffness of left hip, not elsewhere classified: Secondary | ICD-10-CM

## 2015-11-04 DIAGNOSIS — M25651 Stiffness of right hip, not elsewhere classified: Secondary | ICD-10-CM

## 2015-11-05 NOTE — Therapy (Addendum)
[facility] MAIN [facility] SERVICES [address] [place], [place], [address] Phone: [phone]   Fax:  [phone]  Physical Therapy Treatment  Patient Details  Name: Joyce Armstrong MRN: [id number] Date of Birth: [birth year] Referring Provider: Freddrick March  Encounter Date: 11/04/2015      PT End of Session - 11/05/15 1945    Visit Number 9   Number of Visits 13   Date for PT Re-Evaluation 12/22/15   PT Start Time 0945   PT Stop Time 1030   PT Time Calculation (min) 45 min   Activity Tolerance Patient tolerated treatment well;No increased pain      Past Medical History  Diagnosis Date  . Osteoporosis   . GERD (gastroesophageal reflux disease)   . Thickened endometrium     s/p hysteroscopy d&c on 01/21/2014 benign endometrium and no endometrial masses. will repeat u/s in 6 months to reassess lining and congestion.  Marland Kitchen COPD (chronic obstructive pulmonary disease) (HCC)   . Hyperlipidemia   . UTI (urinary tract infection)   . Pelvic congestion     incidental finding of left adnexa vascular congestion on ct and pelvis ultrasound and possible left adnexal cyst    Past Surgical History  Procedure Laterality Date  . Cosmetic surgery  1992  . Breast biopsy Right 2009    neg    There were no vitals filed for this visit.      Subjective Assessment - 11/05/15 1942    Subjective Pt reported she finds temporary relief with the    Pertinent History 2-3 yrs of LBP and hip pain. Pain worsened in September with no known MOI.   Limitations Lifting;Standing;Walking   How long can you stand comfortably? pain initially but this eases off.   How long can you walk comfortably? 20-30  min walking causes pain.   Diagnostic tests DDD   Patient Stated Goals Pt would like to be able to "trim her toenails", walk.                     Adult Aquatic Therapy - 11/05/15 1944    Aquatic Therapy Subjective   Subjective Pt tolerated today's session  without complaints.       O:  Pt entered/ exited pool via steps using rail with unilateral UE support.  Exercises in 3'6" depth   Reviewed last session's exercises.  Added: Shoulder circles, shoulder abd/flexion, spinal rotation, side flexion, ext, mini squat  Sidestepping with shoulder abd/add 1/2 lap R, 1/2 lap L (cued for feet under hips for wider BOS)   [place] with tactile and verbal cuing 1/2 lap R, 1/2 lap L  Box stepping with pt leading the dance 5'   Reviewed new exercises. Educated on how to apply exercises in bed, out of bed, along her hallway in her home. Warming up with small movements, leading to larger movements to free up her body once out of bed and on her feet.   Relaxation 5' ( PT support pt on noodles (seated) and gently dragged through water)            PT Education - 11/05/15 1944    Education provided Yes   Education Details HEP, importance of small ranges of motion upon waking, progressing to larger ranges once out of bed, incorporate dancing to stay active   Person(s) Educated Patient   Methods Explanation   Comprehension Verbalized understanding  PT Long Term Goals - 10/28/15 2230    PT LONG TERM GOAL #1   Title Pt will be able to perform SLR with proper TA contraction indicative of improved core control.   Baseline Currently pt has moderate pain with SLR and no activation of core.   Time 6   Period Weeks   Status On-going   PT LONG TERM GOAL #2   Title Pt will improve hip IR to 25 deg. indicating improvement in hip control/compensation.   Time 6   Period Weeks   Status On-going   PT LONG TERM GOAL #3   Title Pt will be able to stand and walk pain free for 30 min to be able to shop without pain   Baseline pain at 20 min   Time 6   Period Weeks   Status On-going               Plan - 11/05/15 1945    Clinical Impression Statement Pt required less cuing to move within range before pain compared to last session. Pt  showed increased ROM in her hips and spine after gradual progression of movement exercises. Pt demo'd more confidence and postural stability with static and dynamic balance exercises.  PT provided oportunities for pt to explore her own movements and asked pt to recall how to perform ballroom dance moves as an attempt to arouse pt's motivation and pleasure to move on a regular basis. Pt was provided a copy of her exercises for home practice. Pt was educated on the importance of performing gentle movements at the spine and at each joint on a daily basis throughout the day for strength /flexibility in her joints. Pt voiced understanding.  Pt demo'd increased step length/ hip flexion in her gait on land  at the end of the session compared to the beginning of the session.   Rehab Potential Good   Clinical Impairments Affecting Rehab Potential age, prior level of activity, chronicity of pain, motivation   PT Frequency 2x / week   PT Duration 6 weeks   PT Treatment/Interventions Aquatic Therapy;Manual techniques;Therapeutic exercise;Therapeutic activities;ADLs/Self Care Home Management;Dry needling   PT Next Visit Plan Work on lifting weight with proper form from hip and knee height. Continue squats and lunges   PT Home Exercise Plan Continue as prescribed   Consulted and Agree with Plan of Care Patient      Patient will benefit from skilled therapeutic intervention in order to improve the following deficits and impairments:  Improper body mechanics, Decreased range of motion, Impaired flexibility, Decreased mobility, Decreased strength, Pain  Visit Diagnosis: Bilateral low back pain with sciatica, sciatica laterality unspecified  Stiffness of left hip, not elsewhere classified  Stiffness of right hip, not elsewhere classified     Problem List Patient Active Problem List   Diagnosis Date Noted  . Osteoporosis 01/08/2015  . Arthritis 01/03/2014  . CAFL (chronic airflow limitation) (HCC)  01/03/2014  . HLD (hyperlipidemia) 01/03/2014    Mariane MastersYeung,Shin Yiing ,PT, DPT, E-RYT  11/05/2015, 7:52 PM  [facility] [address] [place], [place], [address] Phone: [phone]   Fax:  [phone]  Name: Joyce Bachelorlla Rae Armstrong MRN: [id number] Date of Birth: 10-Sep-1940

## 2015-11-13 ENCOUNTER — Ambulatory Visit: Payer: PPO | Admitting: Physical Therapy

## 2015-11-13 DIAGNOSIS — M5442 Lumbago with sciatica, left side: Principal | ICD-10-CM

## 2015-11-13 DIAGNOSIS — M5441 Lumbago with sciatica, right side: Secondary | ICD-10-CM | POA: Diagnosis not present

## 2015-11-13 NOTE — Therapy (Signed)
[facility] [address] [place], [address] Phone: [phone]   Fax:  [phone]  Physical Therapy Treatment  Patient Details  Name: Joyce Armstrong MRN: [id number] Date of Birth: 01/31/1941 Referring Provider: Lattie Corns  Encounter Date: 11/13/2015      PT End of Session - 11/13/15 1128    Visit Number 10   Number of Visits 13   Date for PT Re-Evaluation 12/22/15   PT Start Time 1100   PT Stop Time 1443   PT Time Calculation (min) 38 min   Activity Tolerance Patient tolerated treatment well;No increased pain      Past Medical History  Diagnosis Date  . Osteoporosis   . GERD (gastroesophageal reflux disease)   . Thickened endometrium     s/p hysteroscopy d&c on 01/21/2014 benign endometrium and no endometrial masses. will repeat u/s in 6 months to reassess lining and congestion.  Marland Kitchen COPD (chronic obstructive pulmonary disease) ([place])   . Hyperlipidemia   . UTI (urinary tract infection)   . Pelvic congestion     incidental finding of left adnexa vascular congestion on ct and pelvis ultrasound and possible left adnexal cyst    Past Surgical History  Procedure Laterality Date  . Cosmetic surgery  1992  . Breast biopsy Right 2009    neg    There were no vitals filed for this visit.      Subjective Assessment - 11/13/15 1111    Subjective Pt returned from [place] recently, she noted swelling in bilateral lower extremeties following her 2 hr flight. She reported that this swelling decreased within 1-2 days. She states that she has not been experiencing shoulder pain since her last visit but does note that her back has been acting up. She states that this is likely a product of her not having exercised much in the last several weeks due to her vacation.    Pertinent History 2-3 yrs of LBP and hip pain. Pain worsened in September with no known MOI.   Limitations Lifting;Standing;Walking   How long can  you stand comfortably? pain initially but this eases off.   How long can you walk comfortably? 20-30  min walking causes pain.   Diagnostic tests DDD   Patient Stated Goals Pt would like to be able to "trim her toenails", walk.   Currently in Pain? Yes   Pain Score 3    Pain Location Groin   Pain Orientation Right;Left              Objective:   PT edu. Pt. On exercising regularly to relieve pain, pt. agreed and reported that she knows she feels better when she does her exercises. Pt. Reports that she has not incorporated ambulating into her regular exercise program yet but intends to.  PT performed soft tissue massage on pt. Lumbar paraspinal musculature to reduce tension and muscle tightness. Pt. Presents with increased muscle tightness on the R LB.   Pt. Performed lumbar rotation stretch to warm up lumbar musculature. PT edu. Pt. On involved lumbar musculature during this activity. Pt. Performed new lumbar rotation stretch with wide angle feet slowly letting LE fall laterally to increase hip and LE involvement. Pt. Reports increased challenge with exercise but that she felt "good' after sitting up and assessing. New activity added to HEP.                   PT Education - 11/13/15 1123  Education provided Yes   Education Details discharge from PT    Person(s) Educated Patient   Methods Explanation   Comprehension Verbalized understanding             PT Long Term Goals - Dec 03, 2015 1155    PT LONG TERM GOAL #1   Title Pt will be able to perform SLR with proper TA contraction indicative of improved core control.   Baseline Currently pt has moderate pain with SLR and no activation of core.   Time 6   Period Weeks   Status Achieved   PT LONG TERM GOAL #2   Title Pt will improve hip IR to 25 deg. indicating improvement in hip control/compensation.   Time 6   Period Weeks   Status Achieved   PT LONG TERM GOAL #3   Title Pt will be able to stand and walk  pain free for 30 min to be able to shop without pain   Baseline pain at 20 min   Time 6   Period Weeks   Status Achieved               Plan - Dec 03, 2015 1128    Clinical Impression Statement Pt is appropriate for d/c at this time. She continues to have mild back and groin symptoms which have improved generally and pt is able to abolish on her own with consistent performance of HEP. All goals met.   Rehab Potential Good   Clinical Impairments Affecting Rehab Potential age, prior level of activity, chronicity of pain, motivation   PT Frequency 2x / week   PT Duration 6 weeks   PT Treatment/Interventions Aquatic Therapy;Manual techniques;Therapeutic exercise;Therapeutic activities;ADLs/Self Care Home Management;Dry needling   PT Next Visit Plan Work on lifting weight with proper form from hip and knee height. Continue squats and lunges   PT Home Exercise Plan Continue as prescribed   Consulted and Agree with Plan of Care Patient      Patient will benefit from skilled therapeutic intervention in order to improve the following deficits and impairments:  Improper body mechanics, Decreased range of motion, Impaired flexibility, Decreased mobility, Decreased strength, Pain  Visit Diagnosis: Bilateral low back pain with sciatica, sciatica laterality unspecified       G-Codes - Dec 03, 2015 1159    Functional Assessment Tool Used hip ROM, squat   Carrying, Moving and Handling Objects Goal Status (K4628) At least 1 percent but less than 20 percent impaired, limited or restricted      Problem List Patient Active Problem List   Diagnosis Date Noted  . Osteoporosis 01/08/2015  . Arthritis 01/03/2014  . CAFL (chronic airflow limitation) ([place]) 01/03/2014  . HLD (hyperlipidemia) 01/03/2014    Aariana Shankland PT DPT Dec 03, 2015, 12:08 PM  [facility] [address] [place], [address] Phone: [phone]   Fax:   [phone]  Name: Joyce Armstrong MRN: [id number] Date of Birth: May 07, 1941

## 2016-01-13 ENCOUNTER — Encounter: Payer: Self-pay | Admitting: Obstetrics and Gynecology

## 2016-01-13 ENCOUNTER — Ambulatory Visit (INDEPENDENT_AMBULATORY_CARE_PROVIDER_SITE_OTHER): Payer: PPO | Admitting: Obstetrics and Gynecology

## 2016-01-13 VITALS — BP 122/62 | HR 68 | Ht 62.5 in | Wt 134.8 lb

## 2016-01-13 DIAGNOSIS — N644 Mastodynia: Secondary | ICD-10-CM | POA: Diagnosis not present

## 2016-01-13 DIAGNOSIS — M546 Pain in thoracic spine: Secondary | ICD-10-CM

## 2016-01-13 DIAGNOSIS — Z803 Family history of malignant neoplasm of breast: Secondary | ICD-10-CM

## 2016-01-13 DIAGNOSIS — Z01419 Encounter for gynecological examination (general) (routine) without abnormal findings: Secondary | ICD-10-CM

## 2016-01-14 NOTE — Progress Notes (Signed)
    GYNECOLOGY PROGRESS NOTE  Subjective:    Patient ID: Joyce Armstrong, female    DOB: 12-29-1940, 75 y.o.   MRN: [id number]  HPI  Patient is a 75 y.o. [id number] postmenopausal female who presents for  physical exam.  Notes that she had a annual exam yesterday by PCP, however did not include breast or pelvic. Denies GYN concerns today.  Wonders if she should still be getting ultrasounds to monitor her pelvic congestion syndrome.    GYN History:  No LMP recorded. Patient is postmenopausal. Denies PMB.  Last Mammogram: 02/2015, normal.  Last Pap smear: > 10 year ago. No h/o abnormal pap smears.  Last Dexa Scan: 07/2011 - Osteoporosis  The following portions of the patient's history were reviewed and updated as appropriate:  She  has a past medical history of Osteoporosis; GERD (gastroesophageal reflux disease); Thickened endometrium; COPD (chronic obstructive pulmonary disease) ([facility]); Hyperlipidemia; UTI (urinary tract infection); and Pelvic congestion. She  has past surgical history that includes Cosmetic surgery (1992) and Breast biopsy (Right, 2009). Her family history includes Breast cancer in her maternal aunt and maternal grandmother; Thyroid disease in her mother. She  reports that she quit smoking about 6 years ago. She has never used smokeless tobacco. She reports that she does not drink alcohol or use illicit drugs.   Current Outpatient Prescriptions on File Prior to Visit  Medication Sig Dispense Refill  . dicyclomine (BENTYL) 10 MG capsule Take 10 mg by mouth 4 (four) times daily -  before meals and at bedtime.    Marland Kitchen. ibuprofen (ADVIL,MOTRIN) 200 MG tablet Take 200 mg by mouth every 6 (six) hours as needed.    . Probiotic Product (PROBIOTIC DAILY PO) Take by mouth.     No current facility-administered medications on file prior to visit.   She has No Known Allergies..  Review of Systems A comprehensive review of systems was negative except for: Gastrointestinal: positive for  mild, intermittent abdominal pain/cramping and bloating Musculoskeletal: positive for back pain   Objective:   Blood pressure 122/62, pulse 68, height 5' 2.5" (1.588 m), weight 134 lb 12.8 oz (61.145 kg). General appearance: alert and no distress, appears stated age Breast: mild tenderness in right breast, no erythema.  No masses palpable bilaterally.  Abdomen: soft, non-tender; bowel sounds normal; no masses,  no organomegaly Pelvic: external genitalia normal, rectovaginal septum normal.  Vagina without discharge, moderately atrophic.  Cervix normal appearing, no lesions and no motion tenderness, flushed with vaginal wall.  Uterus mobile, nontender, senile.  Adnexae non-palpable, nontender bilaterally.  Musculoskeletal: mild point tenderness in middle and lower back Extremities: extremities normal, atraumatic, no cyanosis or edema Neurologic: Grossly normal   Assessment:   Routine GYN exam Right reast tenderness Family h/o breast cancer Back pain (chronic)  Plan:   Routine GYN exam done today (breast and pelvic exam).  Pap smears no longer required base on age.  Patient due for next Mammogram in August.  Continue to monitor for any palpable masses or worsening symptoms from the breast. Currently denies pain outside of today's exam.  Has discussed chronic back pain with PCP, is scheduled for MRI.  F/u in 1 year.  To perform annual exams every other year based on Medicare.      Hildred LaserAnika Sephiroth Mcluckie, MD [facility]

## 2016-01-21 ENCOUNTER — Other Ambulatory Visit: Payer: Self-pay | Admitting: Internal Medicine

## 2016-01-21 DIAGNOSIS — Z1231 Encounter for screening mammogram for malignant neoplasm of breast: Secondary | ICD-10-CM

## 2016-02-04 ENCOUNTER — Other Ambulatory Visit: Payer: Self-pay | Admitting: Neurosurgery

## 2016-02-04 DIAGNOSIS — M160 Bilateral primary osteoarthritis of hip: Secondary | ICD-10-CM

## 2016-02-18 ENCOUNTER — Other Ambulatory Visit: Payer: Self-pay | Admitting: Internal Medicine

## 2016-02-18 ENCOUNTER — Ambulatory Visit
Admission: RE | Admit: 2016-02-18 | Discharge: 2016-02-18 | Disposition: A | Discharge status: Home / Self Care | Payer: PPO | Source: Ambulatory Visit | Attending: Internal Medicine | Admitting: Internal Medicine

## 2016-02-18 ENCOUNTER — Ambulatory Visit: Payer: PPO

## 2016-02-18 ENCOUNTER — Other Ambulatory Visit: Payer: PPO

## 2016-02-18 ENCOUNTER — Ambulatory Visit
Admission: RE | Admit: 2016-02-18 | Discharge: 2016-02-18 | Disposition: A | Discharge status: Home / Self Care | Payer: PPO | Source: Ambulatory Visit | Attending: Neurosurgery | Admitting: Neurosurgery

## 2016-02-18 DIAGNOSIS — M25451 Effusion, right hip: Secondary | ICD-10-CM | POA: Diagnosis not present

## 2016-02-18 DIAGNOSIS — M1611 Unilateral primary osteoarthritis, right hip: Secondary | ICD-10-CM | POA: Insufficient documentation

## 2016-02-18 DIAGNOSIS — Z1231 Encounter for screening mammogram for malignant neoplasm of breast: Secondary | ICD-10-CM | POA: Diagnosis present

## 2016-02-18 DIAGNOSIS — R928 Other abnormal and inconclusive findings on diagnostic imaging of breast: Secondary | ICD-10-CM | POA: Insufficient documentation

## 2016-02-18 DIAGNOSIS — M160 Bilateral primary osteoarthritis of hip: Secondary | ICD-10-CM

## 2016-02-18 DIAGNOSIS — M1612 Unilateral primary osteoarthritis, left hip: Secondary | ICD-10-CM | POA: Diagnosis not present

## 2016-02-23 ENCOUNTER — Other Ambulatory Visit: Payer: Self-pay | Admitting: Internal Medicine

## 2016-02-23 DIAGNOSIS — N6310 Unspecified lump in the right breast, unspecified quadrant: Secondary | ICD-10-CM

## 2016-02-26 ENCOUNTER — Ambulatory Visit
Admission: RE | Admit: 2016-02-26 | Discharge: 2016-02-26 | Disposition: A | Discharge status: Home / Self Care | Payer: PPO | Source: Ambulatory Visit | Attending: Internal Medicine | Admitting: Internal Medicine

## 2016-02-26 ENCOUNTER — Other Ambulatory Visit: Payer: PPO

## 2016-02-26 ENCOUNTER — Other Ambulatory Visit: Payer: Self-pay | Admitting: Internal Medicine

## 2016-02-26 ENCOUNTER — Ambulatory Visit: Payer: PPO

## 2016-02-26 DIAGNOSIS — N63 Unspecified lump in breast: Secondary | ICD-10-CM | POA: Insufficient documentation

## 2016-02-26 DIAGNOSIS — R920 Mammographic microcalcification found on diagnostic imaging of breast: Secondary | ICD-10-CM | POA: Diagnosis not present

## 2016-02-26 DIAGNOSIS — N6310 Unspecified lump in the right breast, unspecified quadrant: Secondary | ICD-10-CM

## 2016-02-27 ENCOUNTER — Ambulatory Visit: Payer: PPO

## 2016-03-05 ENCOUNTER — Ambulatory Visit
Admission: RE | Admit: 2016-03-05 | Discharge: 2016-03-05 | Disposition: A | Discharge status: Home / Self Care | Payer: PPO | Source: Ambulatory Visit | Attending: Internal Medicine | Admitting: Internal Medicine

## 2016-03-05 DIAGNOSIS — N6310 Unspecified lump in the right breast, unspecified quadrant: Secondary | ICD-10-CM

## 2016-04-07 ENCOUNTER — Encounter (INDEPENDENT_AMBULATORY_CARE_PROVIDER_SITE_OTHER): Payer: Self-pay

## 2016-05-11 ENCOUNTER — Encounter (INDEPENDENT_AMBULATORY_CARE_PROVIDER_SITE_OTHER): Payer: Self-pay

## 2016-05-11 ENCOUNTER — Ambulatory Visit (INDEPENDENT_AMBULATORY_CARE_PROVIDER_SITE_OTHER): Payer: Self-pay | Admitting: Vascular Surgery

## 2017-08-05 IMAGING — MR MR LUMBAR SPINE W/O CM
4 of 5 series · 16 of 48 positions shown · non-contrast
Comparison: CT abdomen pelvis sagittal reformatted images
10/11/2014.

CLINICAL DATA: Low back pain with bending. Pain may be worse on the
RIGHT extending into the BILATERAL legs.

EXAM:
MRI LUMBAR SPINE WITHOUT CONTRAST
TECHNIQUE: Multiplanar, multisequence MR imaging of the lumbar spine was
performed. No intravenous contrast was administered.

[Series 3: T2 · sagittal · 4.0mm · 0.44mm/px · 6 of 17 slices shown (1 of 2)]
[im 1/17]
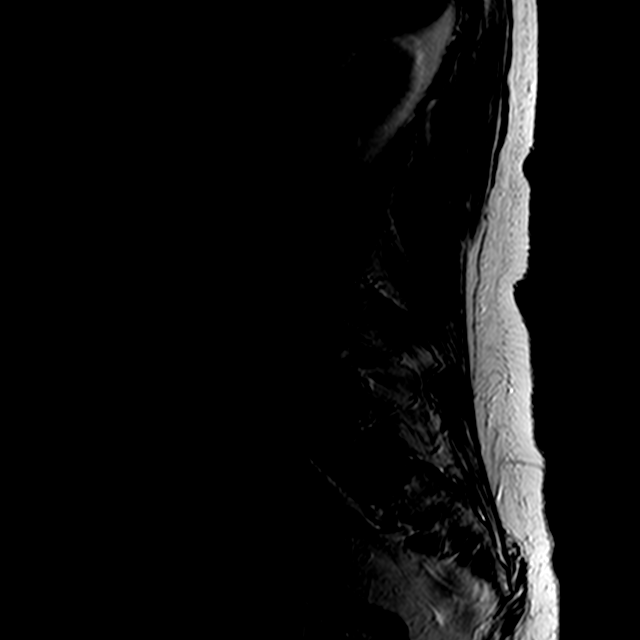
[im 4/17]
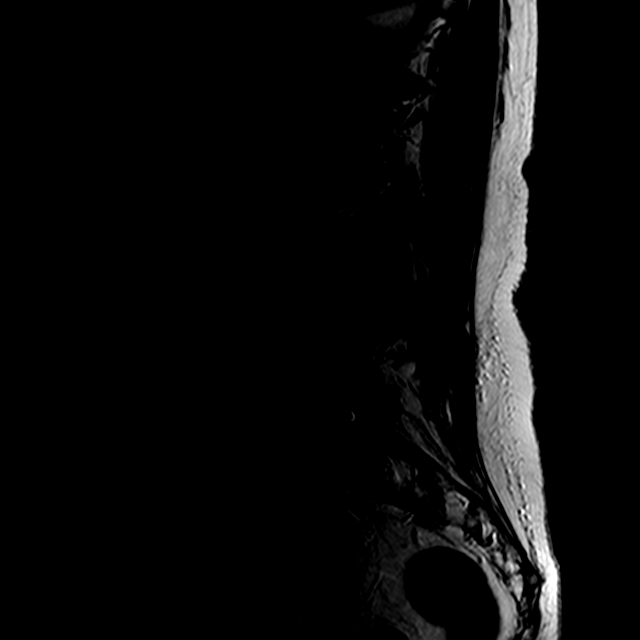
[im 7/17]
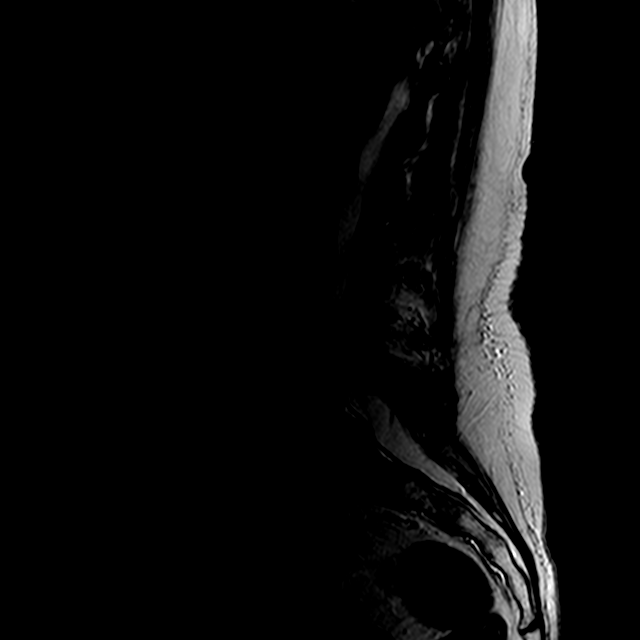
[im 10/17]
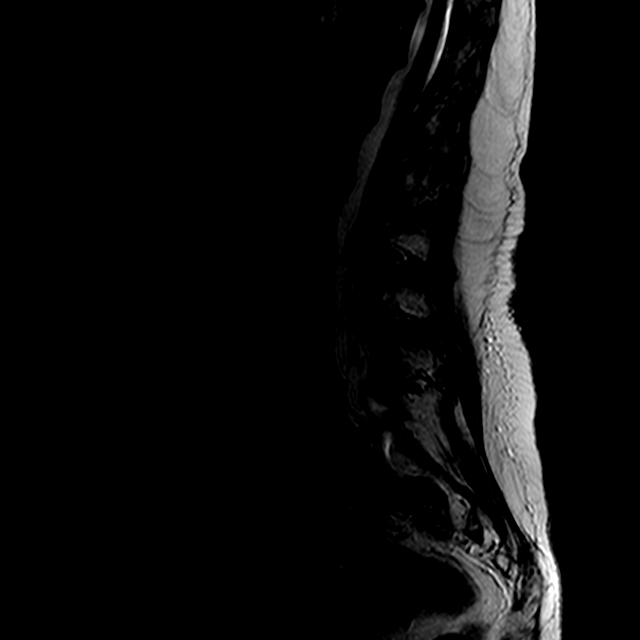
[im 13/17]
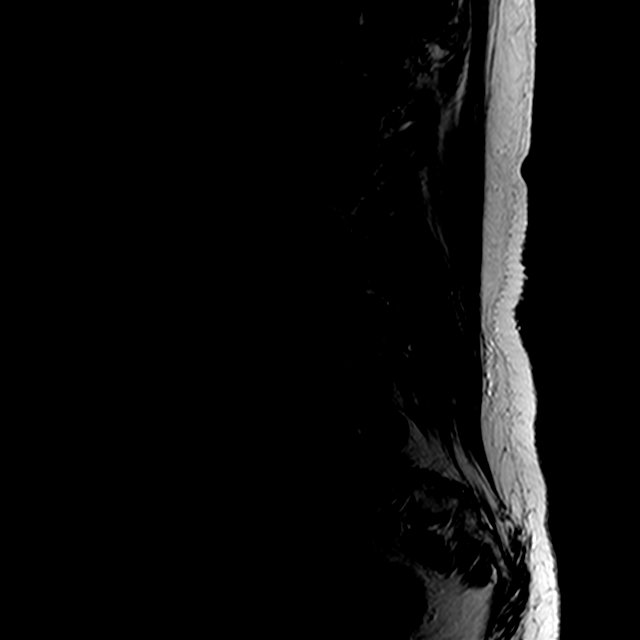
[im 17/17]
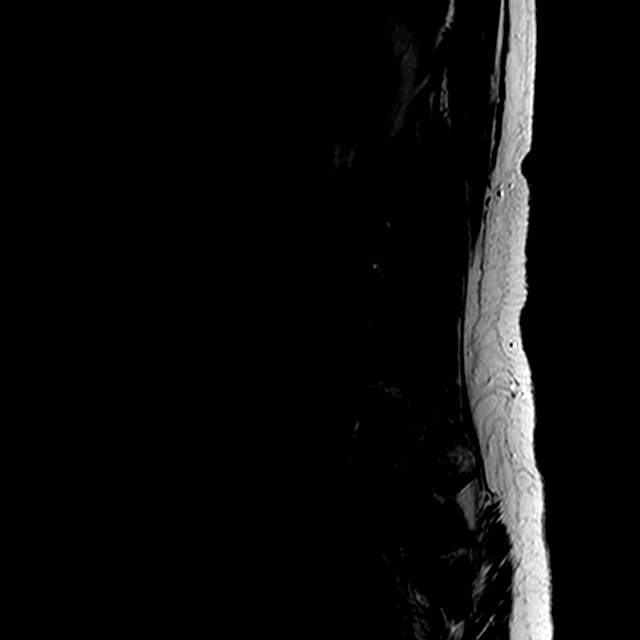

[Series 4: T1 · sagittal · 4.0mm · 0.55mm/px · 3 of 17 slices shown (1 of 2)]
[im 4/17]
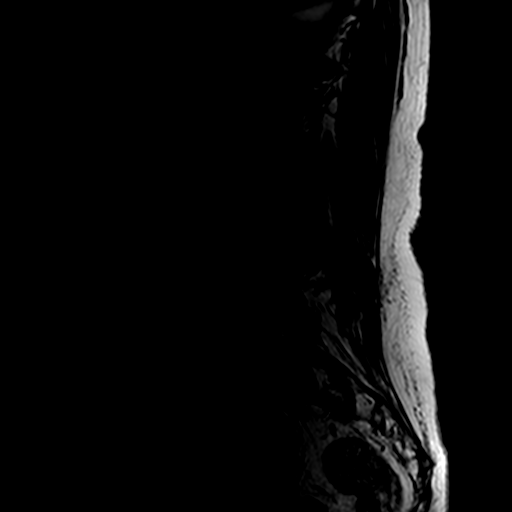
[im 10/17]
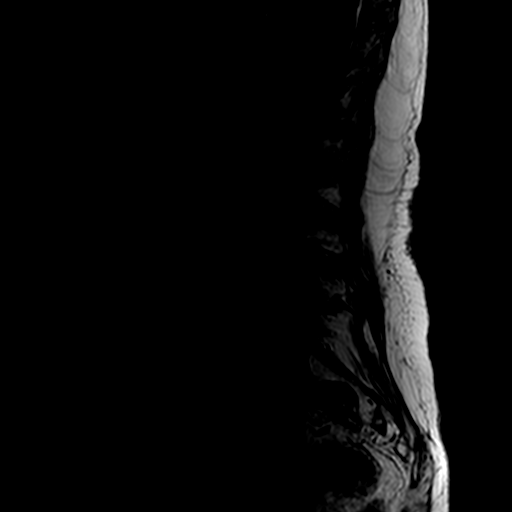
[im 17/17]
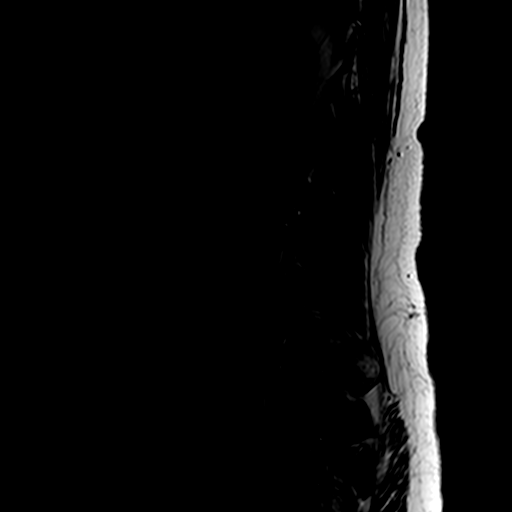

[Series 6: T2 · axial · 4.0mm · 0.39mm/px · z∈[-65,+146]mm · 4 of 40 slices shown (2 of 2)]
[im 1/40]
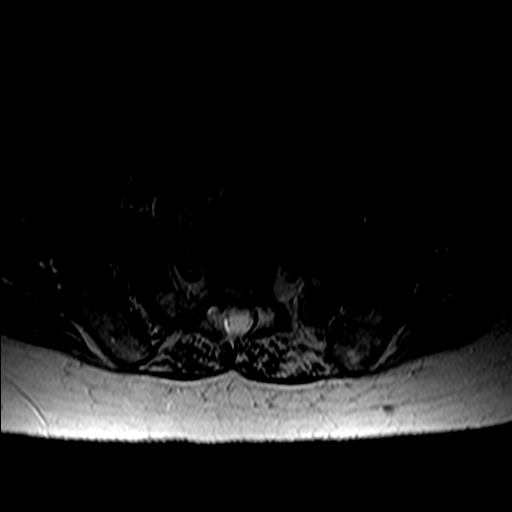
[im 6/40]
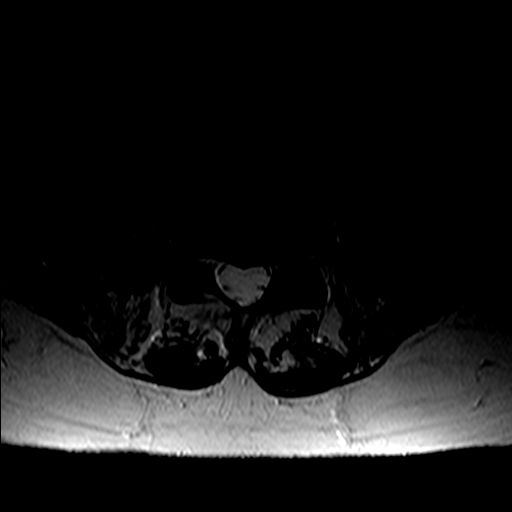
[im 20/40]
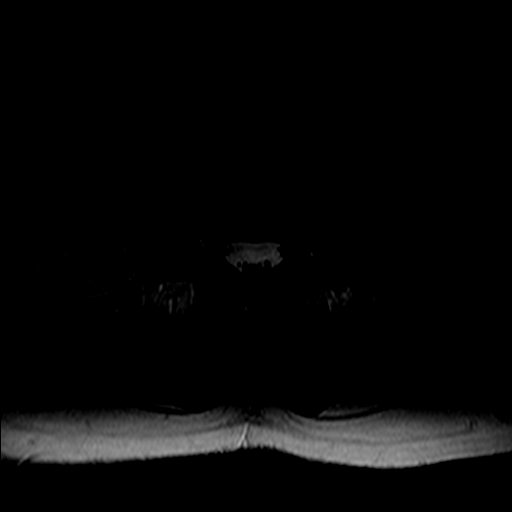
[im 34/40]
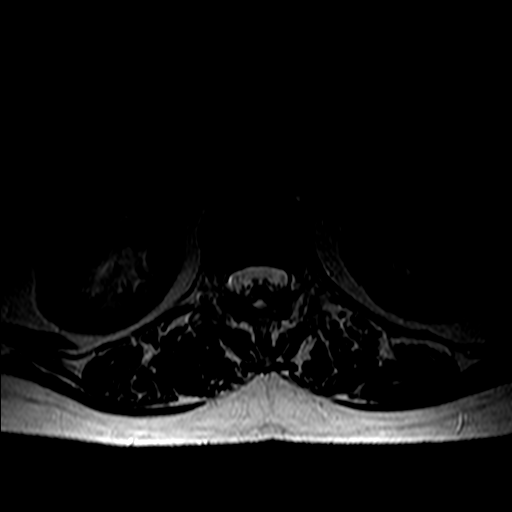

[Series 7: T1 · axial · 4.0mm · 0.39mm/px · z∈[-40,+146]mm · 3 of 40 slices shown (2 of 2)]
[im 6/40]
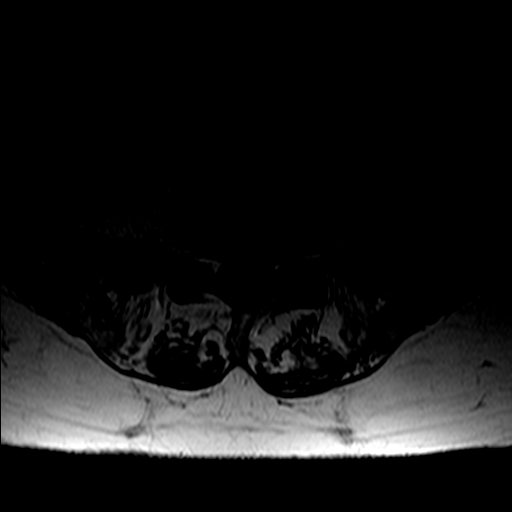
[im 20/40]
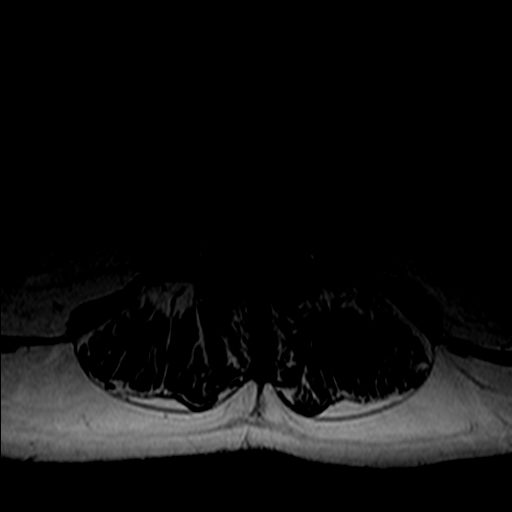
[im 34/40]
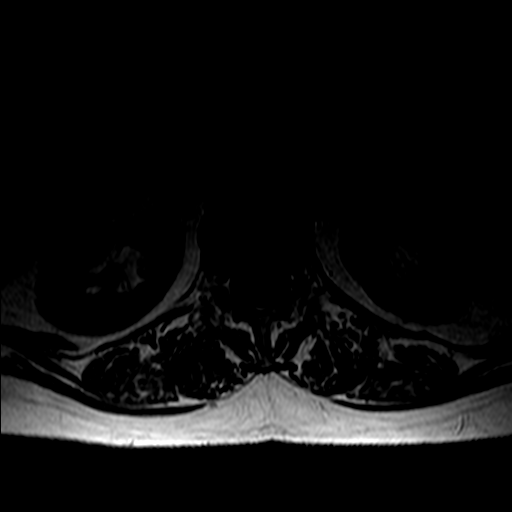

[16 of 48 positions shown; findings below may reference images not displayed]

FINDINGS: Segmentation: Normal.

Alignment:  Normal.

Vertebrae: No worrisome osseous lesion.

Conus medullaris: Normal in size, signal, and location.

Paraspinal tissues: No evidence for hydronephrosis or paravertebral
mass. Incidental renal cystic disease.

Disc levels:

L1-L2:  Mild bulge.  No impingement.

L2-L3:  Mild disc desiccation.  Mild bulge.  No impingement.

L3-L4: Central protrusion. Posterior element hypertrophy. Mild
stenosis. Either L4 nerve root could be affected. LEFT-sided
foraminal narrowing is asymmetric, but does not clearly displace the
L3 nerve root.

L4-L5: Disc space narrowing. Shallow central protrusion. Posterior
element hypertrophy. No L5 neural impingement.

L5-S1: Severe disc space narrowing. Calcified protrusion in the
midline. Posterior element hypertrophy. No subarticular zone
narrowing of significance. Mild BILATERAL foraminal narrowing does
not clearly affect the L5 nerve roots on either side.

Compared with prior CT, good general agreement.
IMPRESSION: Multilevel spondylosis. Disc space narrowing most pronounced at L4-5
and L5-S1.

Mild stenosis at L3-4 is multifactorial, related to central
protrusion and posterior element hypertrophy. Either L4 nerve root
could be affected. This is not clearly worse on the RIGHT however.
